# Patient Record
Sex: Female | Born: 1965 | Race: White | Hispanic: No | Marital: Single | Smoking: Never smoker
Health system: Southern US, Community
[De-identification: ages and names within clinical notes are randomized; demographics above are authoritative.]

## PROBLEM LIST (undated history)

## (undated) DIAGNOSIS — M1990 Unspecified osteoarthritis, unspecified site: Secondary | ICD-10-CM

## (undated) DIAGNOSIS — E079 Disorder of thyroid, unspecified: Secondary | ICD-10-CM

## (undated) DIAGNOSIS — F419 Anxiety disorder, unspecified: Secondary | ICD-10-CM

## (undated) DIAGNOSIS — E039 Hypothyroidism, unspecified: Secondary | ICD-10-CM

## (undated) HISTORY — DX: Anxiety disorder, unspecified: F41.9

## (undated) HISTORY — DX: Disorder of thyroid, unspecified: E07.9

---

## 2013-06-28 HISTORY — PX: HERNIA REPAIR: SHX51

## 2014-03-11 ENCOUNTER — Encounter: Payer: Self-pay | Admitting: Internal Medicine

## 2014-03-11 ENCOUNTER — Ambulatory Visit (INDEPENDENT_AMBULATORY_CARE_PROVIDER_SITE_OTHER): Payer: BC Managed Care – PPO | Admitting: Internal Medicine

## 2014-03-11 VITALS — BP 104/62 | HR 81 | Temp 97.6°F | Resp 12 | Ht 63.0 in | Wt 223.0 lb

## 2014-03-11 DIAGNOSIS — E039 Hypothyroidism, unspecified: Secondary | ICD-10-CM

## 2014-03-11 DIAGNOSIS — N951 Menopausal and female climacteric states: Secondary | ICD-10-CM

## 2014-03-11 LAB — T3, FREE: T3 FREE: 2.7 pg/mL (ref 2.3–4.2)

## 2014-03-11 LAB — T4, FREE: Free T4: 1.14 ng/dL (ref 0.60–1.60)

## 2014-03-11 LAB — TSH: TSH: 0.99 u[IU]/mL (ref 0.35–4.50)

## 2014-03-11 LAB — FOLLICLE STIMULATING HORMONE: FSH: 13.8 m[IU]/mL

## 2014-03-11 NOTE — Progress Notes (Signed)
Patient ID: Hannah Good, female   DOB: [birth year], 48 y.o.   MRN: [id number]   HPI  Hannah Good is a 48 y.o.-year-old female, referred by her PCP, Dr. Sherral Hammers, for management of hypothyroidism.  Pt. has been dx with hypothyroidism in ~1981; is on Levothyroxine, then on Synthroid DAW 112 mcg: - fasting - with water - separated by >30 min from b'fast  - no calcium, iron, PPIs, multivitamins   I reviewed pt's thyroid tests - per records reviewed from PCP: 10/03/2013: TSH 0.769, fT4 1.17 08/31/2012: TSH 1.329, fT4 1.12  Pt describes: - + excessive sweating (head) 30 min after taking Synthroid - lasts 20 min, then reoccurs during the day and throughout the night - + fatigue - + weight gain - in last year: 20 lbs - + constipation - No dry skin - No hair loss - no depression - + anxiety  - on Lorazepam, Pristiq - last 2 years - Dr. Earle Gell  Her last menstrual cycle: 5-6 mo ago.  Pt denies feeling nodules in neck, hoarseness, + occasional dysphagia (meat) /no odynophagia, no SOB with lying down.  She has + FH of thyroid disorders in: mother. + FH of thyroid cancer - niece.  No h/o radiation tx to head or neck.  No recent use of iodine supplements.  I reviewed her chart and she also has a history of anxiety.  ROS: Constitutional: + see HPI, + excess urination, + poor sleep  Eyes: + blurry vision, no xerophthalmia ENT: no sore throat, + see HPI  Cardiovascular: no CP/SOB/palpitations/leg swelling Respiratory: + both: cough/SOB Gastrointestinal: no N/V/D/+ occas. C/+ heartburn Musculoskeletal: no muscle/+ joint aches Skin: no rashes Neurological: no tremors/numbness/tingling/dizziness Psychiatric: no depression/+ anxiety + low libido  Past Medical History  Diagnosis Date  . Thyroid disease   . Anxiety    Past Surgical History  Procedure Laterality Date  . Cesarean section      3  . Hernia repair  2015   History   Occupational History  . homemaker   Social  History Main Topics  . Smoking status: Never Smoker   . Smokeless tobacco: Not on file  . Alcohol Use: ! Glass wine per mo  . Drug Use: No  Exercises on treadmill 3x a week  Social History Narrative   Married: 3 children, 49, 58, 24 yrs   Current Outpatient Rx  Name  Route  Sig  Dispense  Refill  . desvenlafaxine (PRISTIQ) 50 MG 24 hr tablet   Oral   Take 50 mg by mouth at bedtime.         Marland Kitchen levothyroxine (SYNTHROID) 112 MCG tablet   Oral   Take 112 mcg by mouth daily before breakfast.         . LORazepam (ATIVAN) 0.5 MG tablet   Oral   Take 0.5 mg by mouth as needed for anxiety.          NKDA   Family History  Problem Relation Age of Onset  . Chronic fatigue Mother   . Fibromyalgia Mother   . Cancer Father     leukemia  . Heart disease Father    PE: BP 104/62  Pulse 81  Temp(Src) 97.6 F (36.4 C) (Oral)  Resp 12  Ht  (1.6 m)  Wt 223 lb (101.152 kg)  BMI 39.51 kg/m2  SpO2 96% Wt Readings from Last 3 Encounters:  03/11/14 223 lb (101.152 kg)   Constitutional: overweight, in NAD Eyes: PERRLA, EOMI, no  exophthalmos ENT: moist mucous membranes, no thyromegaly, no cervical lymphadenopathy Cardiovascular: RRR, No MRG Respiratory: CTA B Gastrointestinal: abdomen soft, NT, ND, BS+ Musculoskeletal: no deformities, strength intact in all 4 Skin: moist, warm, no rashes Neurological: no tremor with outstretched hands, DTR normal in all 4  ASSESSMENT: 1. Hypothyroidism  2. Hot flushes  PLAN:  1. Patient with long-standing hypothyroidism, on levothyroxine therapy. She appears euthyroid. She does not appear to have a goiter, thyroid nodules, or neck compression symptoms - We discussed about correct intake of levothyroxine, fasting, with water, separated by at least 30 minutes from breakfast, and separated by more than 4 hours from calcium, iron, multivitamins, acid reflux medications (PPIs). She is taking it correctly.  - will check thyroid tests today:  TSH, free T4, free T3 and TPO antibodies - If these are abnormal, she will need to return in 6-8 weeks for repeat labs - If these are normal, I will see her back in 6 months  2. Hot flushes - she start having hot flushes 30 min after taking the Synthroid in am - I advised her to skip the dose for 2 days and see if the hot flushes occur. If they do not >> will switch her to taking 2.5 tabs of the 50 mcg Synthroid tablet as she may be allergic to one of the excipients.  - however, I believe that these are perimenopausal flushes >> will check FSH  Component     Latest Ref Rng 03/11/2014  TSH     0.35 - 4.50 uIU/mL 0.99  Free T4     0.60 - 1.60 ng/dL 1.61  T3, Free     2.3 - 4.2 pg/mL 2.7  Thyroid Peroxidase Antibody     <9 IU/mL 211 (H)  FSH      13.8   The Thyroid tests are normal, but antibodies positive >> Hashimoto's thyroiditis. The [facility] is not in the [facility], but getting there. She is perimenopausal.

## 2014-03-11 NOTE — Patient Instructions (Signed)
Please stop at the lab. Please come back for a follow-up appointment in 6 months. Continue the Synthroid 112 mcg for now.

## 2014-03-12 ENCOUNTER — Encounter: Payer: Self-pay | Admitting: Internal Medicine

## 2014-03-12 DIAGNOSIS — N951 Menopausal and female climacteric states: Secondary | ICD-10-CM | POA: Insufficient documentation

## 2014-03-12 DIAGNOSIS — E038 Other specified hypothyroidism: Secondary | ICD-10-CM | POA: Insufficient documentation

## 2014-03-12 DIAGNOSIS — E063 Autoimmune thyroiditis: Secondary | ICD-10-CM

## 2014-03-12 LAB — THYROID PEROXIDASE ANTIBODY: Thyroperoxidase Ab SerPl-aCnc: 211 IU/mL — ABNORMAL HIGH (ref <9)

## 2014-03-25 ENCOUNTER — Encounter: Payer: Self-pay | Admitting: Internal Medicine

## 2014-03-26 ENCOUNTER — Other Ambulatory Visit: Payer: Self-pay | Admitting: *Deleted

## 2014-03-26 MED ORDER — SYNTHROID 112 MCG PO TABS: 112.0000 ug | ORAL_TABLET | Freq: Every day | ORAL | Status: DC

## 2014-07-25 ENCOUNTER — Other Ambulatory Visit: Payer: Self-pay | Admitting: Internal Medicine

## 2014-09-02 ENCOUNTER — Encounter: Payer: Self-pay | Admitting: Internal Medicine

## 2014-09-09 ENCOUNTER — Ambulatory Visit: Payer: BC Managed Care – PPO | Admitting: Internal Medicine

## 2014-10-17 ENCOUNTER — Ambulatory Visit (INDEPENDENT_AMBULATORY_CARE_PROVIDER_SITE_OTHER): Payer: Self-pay | Admitting: Internal Medicine

## 2014-10-17 ENCOUNTER — Encounter: Payer: Self-pay | Admitting: Internal Medicine

## 2014-10-17 VITALS — BP 122/60 | HR 69 | Temp 97.5°F | Resp 12 | Wt 211.0 lb

## 2014-10-17 DIAGNOSIS — E063 Autoimmune thyroiditis: Secondary | ICD-10-CM

## 2014-10-17 DIAGNOSIS — E038 Other specified hypothyroidism: Secondary | ICD-10-CM

## 2014-10-17 LAB — T4, FREE: Free T4: 1.12 ng/dL (ref 0.60–1.60)

## 2014-10-17 LAB — TSH: TSH: 0.73 u[IU]/mL (ref 0.35–4.50)

## 2014-10-17 NOTE — Patient Instructions (Signed)
Please stop at the lab.  Please return in 1 year.  

## 2014-10-17 NOTE — Progress Notes (Signed)
Patient ID: Hannah Good, female   DOB: 08/07/1965, 49 y.o.   MRN: [id number]   HPI  ARIYAN SINNETT is a 49 y.o.-year-old female, returning for f/u for hypothyroidism, with a new Dx of Hashimoto's thyroiditis. Last visit 6 mo ago.  She lost 12 lbs since last visit (started 06/2014)   Reviewed and addended hx: Pt. has been dx with hypothyroidism in ~1981; is on Levothyroxine, now on Synthroid DAW 112 mcg: - fasting - with water - separated by >30 min from b'fast  - no calcium, iron, PPIs, multivitamins   I reviewed pt's thyroid tests  Lab Results  Component Value Date   TSH 0.99 03/11/2014   FREET4 1.14 03/11/2014  - per records reviewed from PCP: 10/03/2013: TSH 0.769, fT4 1.17 08/31/2012: TSH 1.329, fT4 1.12  At last visit, her TPO ABs were + >> Hashimoto's thyroiditis: Component     Latest Ref Rng 03/11/2014  Thyroid Peroxidase Antibody     <9 IU/mL 211 (H)   Pt describes: - + excessive sweating - flushes - not every day  - no fatigue - + weight loss - in last 6 mo - no constipation - no dry skin - no hair loss; + excessive hair growth - no depression - + anxiety  - on Lorazepam, Pristiq - Dr. Earle Gell   Pt denies feeling nodules in neck, hoarseness, no dysphagia /no odynophagia, no SOB with lying down.  She has + FH of thyroid disorders in: mother. + FH of thyroid cancer - niece.   She also has a history of anxiety.  ROS: Constitutional: + weight loss, no fatigue, + hot flushes Eyes: no blurry vision, no xerophthalmia ENT: no sore throat, no nodules palpated in throat, no dysphagia/odynophagia, no hoarseness Cardiovascular: no CP/SOB/palpitations/leg swelling Respiratory: no cough/SOB Gastrointestinal: no N/V/D/C Musculoskeletal: no muscle/joint aches Skin: no rashes Neurological: no tremors/numbness/tingling/dizziness + low libido  I reviewed pt's medications, allergies, PMH, social hx, family hx, and changes were documented in the history of present  illness. Otherwise, unchanged from my initial visit note: Past Medical History  Diagnosis Date  . Thyroid disease   . Anxiety    Past Surgical History  Procedure Laterality Date  . Cesarean section      3  . Hernia repair  2015   History   Occupational History  . homemaker   Social History Main Topics  . Smoking status: Never Smoker   . Smokeless tobacco: Not on file  . Alcohol Use: ! Glass wine per mo  . Drug Use: No  Exercises on treadmill 3x a week  Social History Narrative   Married: 3 children, 93, 60, 24 yrs   Current Outpatient Rx  Name  Route  Sig  Dispense  Refill  . desvenlafaxine (PRISTIQ) 50 MG 24 hr tablet   Oral   Take 50 mg by mouth at bedtime.         Marland Kitchen LORazepam (ATIVAN) 0.5 MG tablet   Oral   Take 0.5 mg by mouth as needed for anxiety.         . SYNTHROID 112 MCG tablet      TAKE 1 TABLET EVERY DAY BEFORE BREAKFAST   30 tablet   2     Dispense as written.    NKDA   Family History  Problem Relation Age of Onset  . Chronic fatigue Mother   . Fibromyalgia Mother   . Cancer Father     leukemia  . Heart disease Father  PE: BP 122/60 mmHg  Pulse 69  Temp(Src) 97.5 F (36.4 C) (Oral)  Resp 12  Wt 211 lb (95.709 kg)  SpO2 98% Body mass index is 37.39 kg/(m^2). Wt Readings from Last 3 Encounters:  10/17/14 211 lb (95.709 kg)  03/11/14 223 lb (101.152 kg)   Constitutional: overweight, in NAD Eyes: PERRLA, EOMI, no exophthalmos ENT: moist mucous membranes, + mild symmetric thyromegaly, no cervical lymphadenopathy Cardiovascular: RRR, No MRG Respiratory: CTA B Gastrointestinal: abdomen soft, NT, ND, BS+ Musculoskeletal: no deformities, strength intact in all 4 Skin: moist, warm, no rashes Neurological: no tremor with outstretched hands, DTR normal in all 4  ASSESSMENT: 1. Hypothyroidism  2. Hashimoto's thyroiditis  PLAN:  1. Patient with long-standing hypothyroidism, on levothyroxine therapy. She appears euthyroid. She  does not appear to have a goiter, thyroid nodules, or neck compression symptoms. She lost weight since last visit - diet - congratulated her! - we discussed about what Hashimoto's thyroiditis means - We discussed about correct intake of levothyroxine, fasting, with water, separated by at least 30 minutes from breakfast, and separated by more than 4 hours from calcium, iron, multivitamins, acid reflux medications (PPIs). She is taking it correctly.  - will check thyroid tests today: TSH, free T4 - If these are abnormal, she will need to return in 6-8 weeks for repeat labs - If these are normal, I will see her back in 1 year  2. Hashimoto's thyroiditis - We discussed about her new diagnosis of Hashimoto thyroiditis. I explained how we make this diagnosis, and the fact that this is an autoimmune disease. I also explained that she may have hypothyroid symptoms due to the higher antibody titer even when her TFTs are normal. Hashimoto's thyroiditis is treated with levothyroxine if there is associated hypothyroidism, which he already has.  Needs Synthroid refill - 3 mo.  Office Visit on 10/17/2014  Component Date Value Ref Range Status  . TSH 10/17/2014 0.73  0.35 - 4.50 uIU/mL Final  . Free T4 10/17/2014 1.12  0.60 - 1.60 ng/dL Final   TFTs normal >> will refill Synthroid DAW 112 mcg.

## 2014-10-18 DIAGNOSIS — E063 Autoimmune thyroiditis: Secondary | ICD-10-CM | POA: Insufficient documentation

## 2014-10-18 MED ORDER — SYNTHROID 112 MCG PO TABS: ORAL_TABLET | ORAL | Status: DC

## 2014-11-03 ENCOUNTER — Other Ambulatory Visit: Payer: Self-pay | Admitting: Internal Medicine

## 2015-10-14 ENCOUNTER — Other Ambulatory Visit: Payer: Self-pay | Admitting: *Deleted

## 2015-10-14 ENCOUNTER — Encounter: Payer: Self-pay | Admitting: Internal Medicine

## 2015-10-14 ENCOUNTER — Telehealth: Payer: Self-pay | Admitting: Internal Medicine

## 2015-10-14 MED ORDER — SYNTHROID 112 MCG PO TABS: ORAL_TABLET | ORAL | Status: DC

## 2015-10-14 NOTE — Telephone Encounter (Signed)
Pt sent a message via MyChart requesting refill to be sent to [facility].

## 2015-10-14 NOTE — Telephone Encounter (Signed)
Please call in a bridge of the pts meds until she can get into the appt in June, your 1st available

## 2015-10-14 NOTE — Telephone Encounter (Signed)
Bridge refills sent to pt's pharmacy.

## 2015-10-16 ENCOUNTER — Ambulatory Visit: Payer: Self-pay | Admitting: Internal Medicine

## 2015-10-21 ENCOUNTER — Ambulatory Visit: Payer: Self-pay | Admitting: Internal Medicine

## 2015-12-03 ENCOUNTER — Encounter: Payer: Self-pay | Admitting: Internal Medicine

## 2015-12-03 ENCOUNTER — Ambulatory Visit (INDEPENDENT_AMBULATORY_CARE_PROVIDER_SITE_OTHER): Payer: BLUE CROSS/BLUE SHIELD | Admitting: Internal Medicine

## 2015-12-03 VITALS — BP 122/60 | HR 81 | Temp 97.8°F | Resp 12 | Wt 227.0 lb

## 2015-12-03 DIAGNOSIS — E063 Autoimmune thyroiditis: Secondary | ICD-10-CM | POA: Diagnosis not present

## 2015-12-03 DIAGNOSIS — E038 Other specified hypothyroidism: Secondary | ICD-10-CM

## 2015-12-03 DIAGNOSIS — R7989 Other specified abnormal findings of blood chemistry: Secondary | ICD-10-CM

## 2015-12-03 LAB — T4, FREE: Free T4: 0.84 ng/dL (ref 0.60–1.60)

## 2015-12-03 LAB — TSH: TSH: 8.36 u[IU]/mL — ABNORMAL HIGH (ref 0.35–4.50)

## 2015-12-03 NOTE — Progress Notes (Unsigned)
Please see the my chart message the pt has not missed any doses [phone] cell #

## 2015-12-03 NOTE — Progress Notes (Signed)
Patient ID: Hannah Good, female   DOB: 1966/01/18, 50 y.o.   MRN: [id number]   HPI  Hannah Good is a 50 y.o.-year-old female, returning for f/u for hypothyroidism, with a new Dx of Hashimoto's thyroiditis. Last visit 1 year ago.  She had diverticulitis in 05/2015.  Reviewed and addended hx: Pt. has been dx with hypothyroidism in ~1981; is on Levothyroxine, now on Synthroid DAW 112 mcg: - fasting - with water - separated by >30 min from b'fast  - no calcium, iron, PPIs, multivitamins   I reviewed pt's thyroid tests  Lab Results  Component Value Date   TSH 0.73 10/17/2014   TSH 0.99 03/11/2014   FREET4 1.12 10/17/2014   FREET4 1.14 03/11/2014  - per records reviewed from PCP: 10/03/2013: TSH 0.769, fT4 1.17 08/31/2012: TSH 1.329, fT4 1.12  Her TPO ABs were + >> Hashimoto's thyroiditis: Component     Latest Ref Rng 03/11/2014  Thyroid Peroxidase Antibody     <9 IU/mL 211 (H)   Pt describes: - + hot flushes - no fatigue - no weight loss, + weight gain - no constipation - no dry skin - no hair loss; + excessive hair growth - no depression  Pt denies feeling nodules in neck, hoarseness, no dysphagia /no odynophagia, no SOB with lying down.  She has + FH of thyroid disorders in: mother. + FH of thyroid cancer - niece.   She also has a history of anxiety.  ROS: Constitutional: + weight gain, no fatigue, + hot flushes Eyes: no blurry vision, no xerophthalmia ENT: + sore throat, no nodules palpated in throat, no dysphagia/odynophagia, no hoarseness Cardiovascular: no CP/SOB/palpitations/leg swelling Respiratory: no cough/SOB Gastrointestinal: no N/V/D/C Musculoskeletal: no muscle/+ joint aches Skin: no rashes  I reviewed pt's medications, allergies, PMH, social hx, family hx, and changes were documented in the history of present illness. Otherwise, unchanged from my initial visit note: Past Medical History  Diagnosis Date  . Thyroid disease   . Anxiety     Past Surgical History  Procedure Laterality Date  . Cesarean section      3  . Hernia repair  2015   History   Occupational History  . homemaker   Social History Main Topics  . Smoking status: Never Smoker   . Smokeless tobacco: Not on file  . Alcohol Use: ! Glass wine per mo  . Drug Use: No  Exercises on treadmill 3x a week  Social History Narrative   Married: 3 children, 6711, 2822, 24 yrs   Current Outpatient Rx  Name  Route  Sig  Dispense  Refill  . desvenlafaxine (PRISTIQ) 50 MG 24 hr tablet   Oral   Take 50 mg by mouth at bedtime.         Marland Kitchen. LORazepam (ATIVAN) 0.5 MG tablet   Oral   Take 0.5 mg by mouth as needed for anxiety.         . meloxicam (MOBIC) 15 MG tablet               . progesterone (PROMETRIUM) 100 MG capsule   Oral   Take 100 mg by mouth. TAKE ONE CAPSULE BY MOUTH 30-45 MINUTES BEFORE BEDTIME         . SYNTHROID 112 MCG tablet      TAKE 1 TABLET EVERY DAY BEFORE BREAKFAST   30 tablet   2     Dispense as written.    NKDA   Family History  Problem Relation  Age of Onset  . Chronic fatigue Mother   . Fibromyalgia Mother   . Cancer Father     leukemia  . Heart disease Father    PE: BP 122/60 mmHg  Pulse 81  Temp(Src) 97.8 F (36.6 C) (Oral)  Resp 12  Wt 227 lb (102.967 kg)  SpO2 96% Body mass index is 40.22 kg/(m^2). Wt Readings from Last 3 Encounters:  12/03/15 227 lb (102.967 kg)  10/17/14 211 lb (95.709 kg)  03/11/14 223 lb (101.152 kg)   Constitutional: overweight, in NAD Eyes: PERRLA, EOMI, no exophthalmos ENT: moist mucous membranes, no thyromegaly, no cervical lymphadenopathy Cardiovascular: RRR, No MRG Respiratory: CTA B Gastrointestinal: abdomen soft, NT, ND, BS+ Musculoskeletal: no deformities, strength intact in all 4 Skin: moist, warm, no rashes Neurological: no tremor with outstretched hands, DTR normal in all 4  ASSESSMENT: 1. Hypothyroidism 2/2 Hashimoto's thyroiditis  2. Low Progesterone  PLAN:   1. Patient with long-standing hypothyroidism, on levothyroxine therapy (Synthroid 112 g daily). She appears euthyroid. She does not appear to have a goiter, thyroid nodules, or neck compression symptoms. - we discussed about what Hashimoto's thyroiditis means - We discussed about correct intake of levothyroxine, fasting, with water, separated by at least 30 minutes from breakfast, and separated by more than 4 hours from calcium, iron, multivitamins, acid reflux medications (PPIs). She is taking it correctly.  - will check thyroid tests today: TSH, free T4 - If these are abnormal, she will need to return in 6-8 weeks for repeat labs - If these are normal, I will see her back in 1 year  2. Low Progesterone - was in a study >> now on Prometrium 100 mg at night >> helps with sleep, but she feels dizzy - she was wondering if she needs to continue >> I advised her that she can stop the Pg and see how she feels w/o it  Needs Synthroid refill - 3 mo.  Office Visit on 12/03/2015  Component Date Value Ref Range Status  . Free T4 12/03/2015 0.84  0.60 - 1.60 ng/dL Final  . TSH 16/03/9603 8.36* 0.35 - 4.50 uIU/mL Final   Patient confirmed that she is taking the medication every day. In this case, I will send a prescription for Synthroid 125 g daily to her pharmacy and recheck her tests in 2 months.

## 2015-12-03 NOTE — Patient Instructions (Signed)
Please stop at the lab.  Please continue Synthroid 112 mcg daily.  Take the thyroid hormone every day, with water, at least 30 minutes before breakfast, separated by at least 4 hours from: - acid reflux medications - calcium - iron - multivitamins  Please return in 1 year. 

## 2015-12-04 MED ORDER — SYNTHROID 125 MCG PO TABS: 125.0000 ug | ORAL_TABLET | Freq: Every day | ORAL | Status: DC

## 2016-01-28 ENCOUNTER — Other Ambulatory Visit: Payer: BLUE CROSS/BLUE SHIELD

## 2016-01-28 ENCOUNTER — Other Ambulatory Visit (INDEPENDENT_AMBULATORY_CARE_PROVIDER_SITE_OTHER): Payer: BLUE CROSS/BLUE SHIELD

## 2016-01-28 DIAGNOSIS — E063 Autoimmune thyroiditis: Secondary | ICD-10-CM | POA: Diagnosis not present

## 2016-01-28 DIAGNOSIS — E038 Other specified hypothyroidism: Secondary | ICD-10-CM

## 2016-01-28 LAB — TSH: TSH: 0.8 u[IU]/mL (ref 0.35–4.50)

## 2016-01-28 LAB — T4, FREE: FREE T4: 1.16 ng/dL (ref 0.60–1.60)

## 2016-01-29 ENCOUNTER — Encounter: Payer: Self-pay | Admitting: Internal Medicine

## 2016-01-30 ENCOUNTER — Other Ambulatory Visit: Payer: Self-pay

## 2016-01-30 MED ORDER — SYNTHROID 125 MCG PO TABS: 125.0000 ug | ORAL_TABLET | Freq: Every day | ORAL | 0 refills | Status: DC

## 2016-04-01 ENCOUNTER — Other Ambulatory Visit: Payer: Self-pay

## 2016-04-01 MED ORDER — SYNTHROID 125 MCG PO TABS: 125.0000 ug | ORAL_TABLET | Freq: Every day | ORAL | 0 refills | Status: DC

## 2016-07-02 ENCOUNTER — Other Ambulatory Visit: Payer: Self-pay | Admitting: Surgery

## 2016-07-02 ENCOUNTER — Ambulatory Visit
Admission: RE | Admit: 2016-07-02 | Discharge: 2016-07-02 | Disposition: A | Discharge status: Home / Self Care | Payer: BLUE CROSS/BLUE SHIELD | Source: Ambulatory Visit | Attending: Surgery | Admitting: Surgery

## 2016-07-02 DIAGNOSIS — K5700 Diverticulitis of small intestine with perforation and abscess without bleeding: Secondary | ICD-10-CM

## 2016-07-02 IMAGING — CT CT ABD-PELV W/ CM
2 of 5 series · 16 of 46 positions shown, 18 images · IV contrast (APPLIED)
Comparison: None.

CLINICAL DATA: Abdominal pain. Intermittent diarrhea since [DATE].

EXAM:
CT ABDOMEN AND PELVIS WITH CONTRAST
TECHNIQUE: Multidetector CT imaging of the abdomen and pelvis was performed
using the standard protocol following bolus administration of
intravenous contrast.
CONTRAST:  100mL [YE] IOPAMIDOL ([YE]) INJECTION 61%

[Series 2: abd/pelvis w/cm · axial · 0.76mm/px · z∈[-667,-217]mm · 13 of 102 slices shown, 15 images]
[im 6/102  soft-tissue]
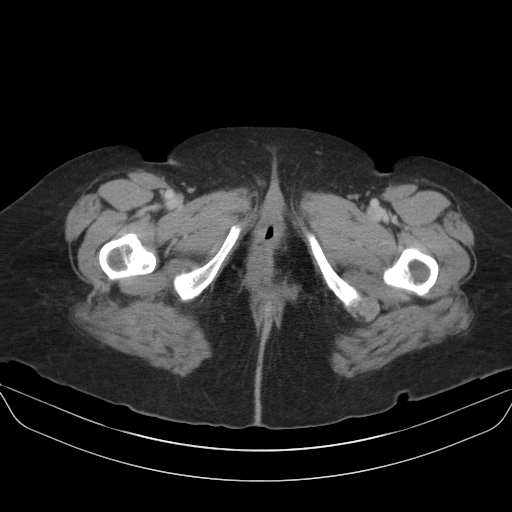
[im 6/102  bone]
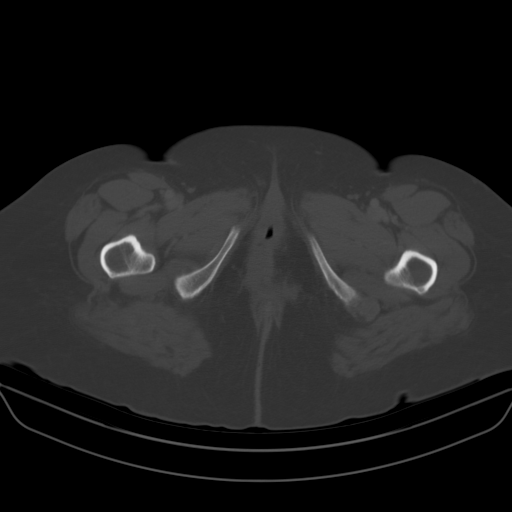
[im 12/102  soft-tissue]
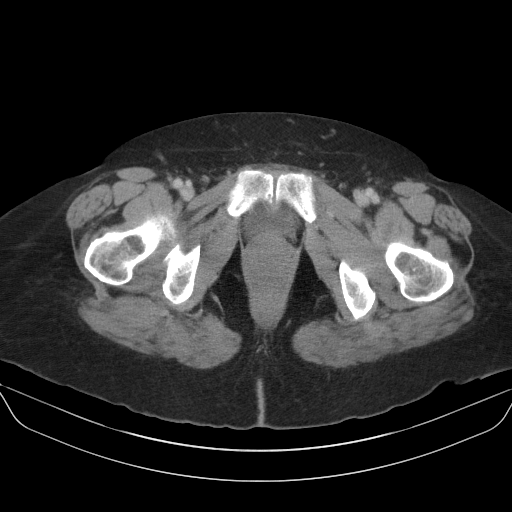
[im 24/102  soft-tissue]
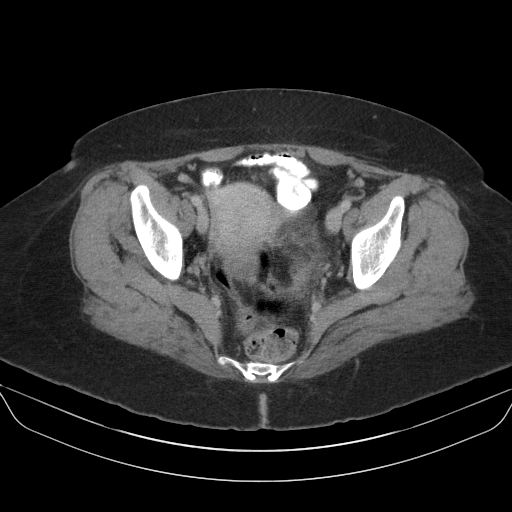
[im 30/102  soft-tissue]
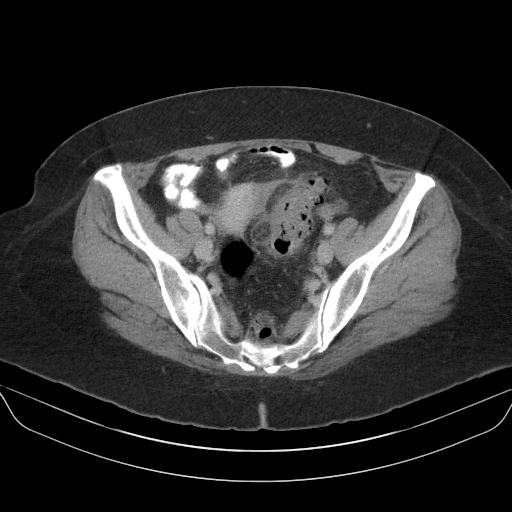
[im 36/102  soft-tissue]
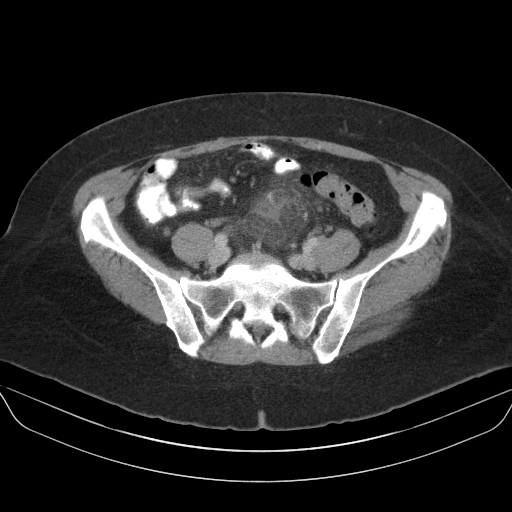
[im 42/102  soft-tissue]
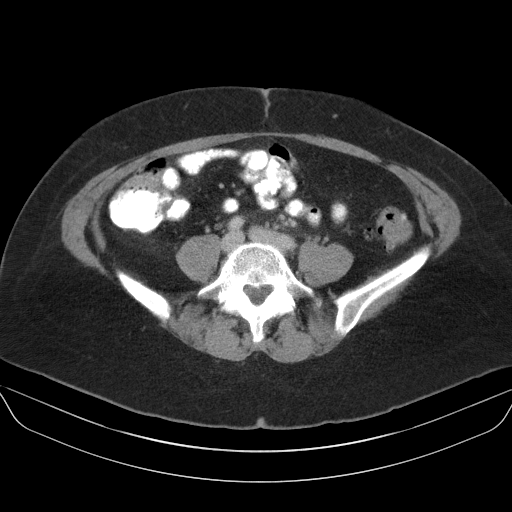
[im 54/102  soft-tissue]
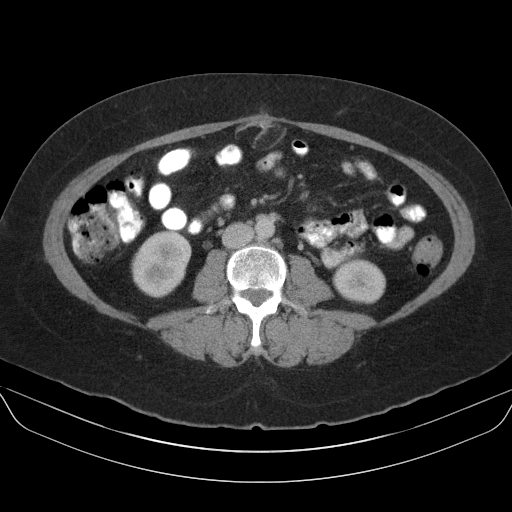
[im 60/102  soft-tissue]
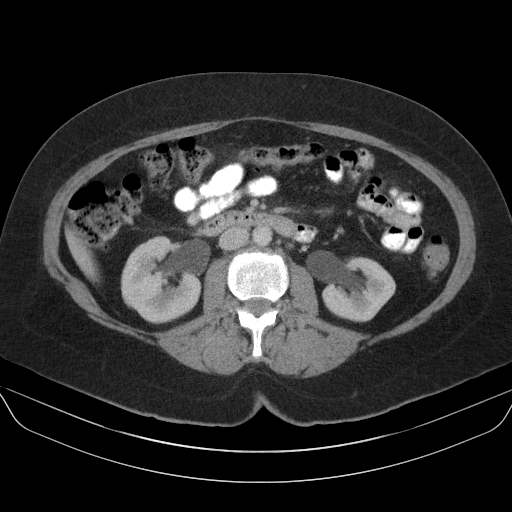
[im 66/102  soft-tissue]
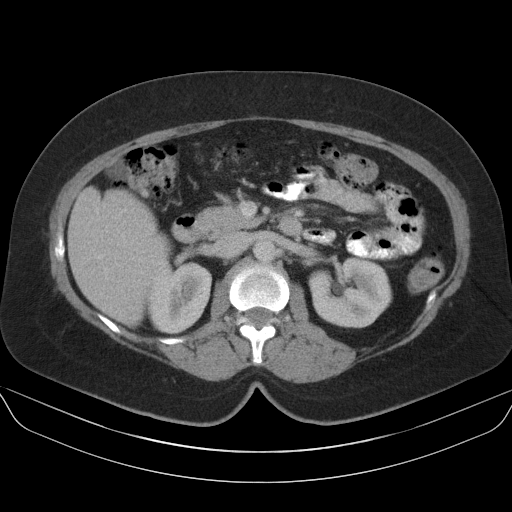
[im 66/102  bone]
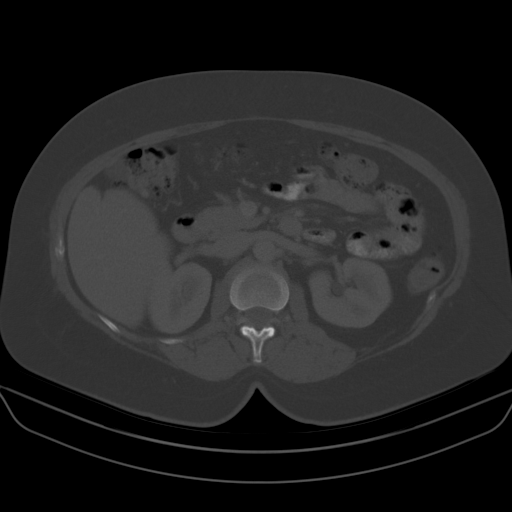
[im 72/102  soft-tissue]
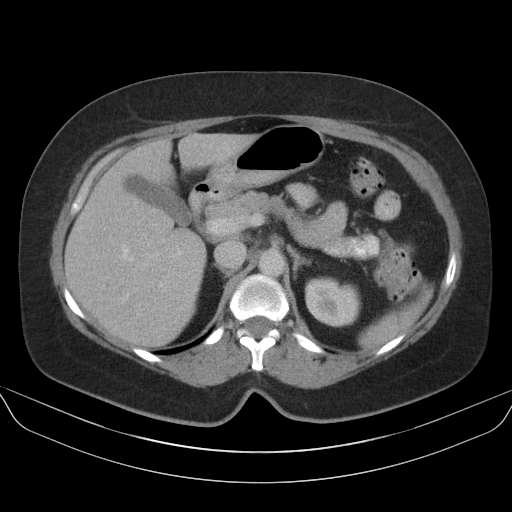
[im 78/102  soft-tissue]
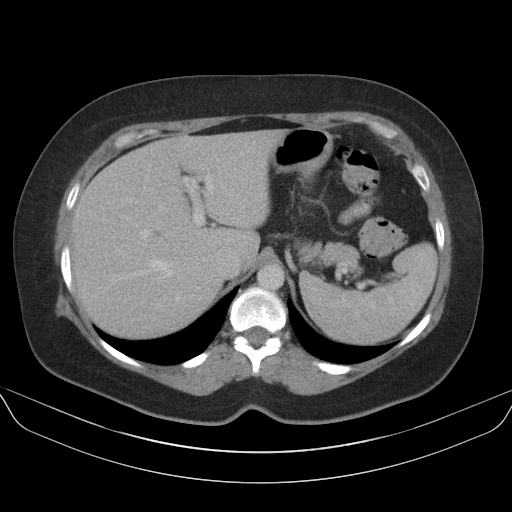
[im 90/102  soft-tissue]
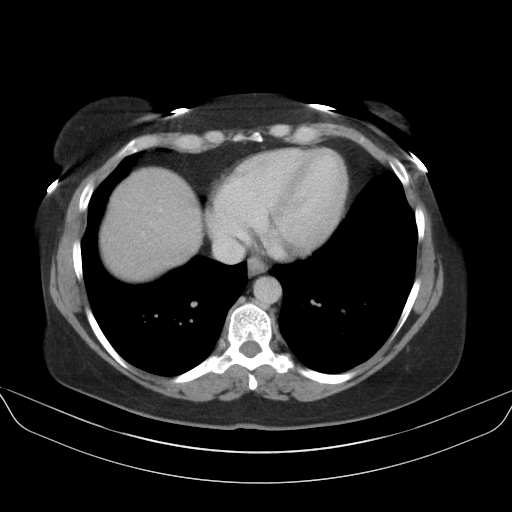
[im 96/102  soft-tissue]
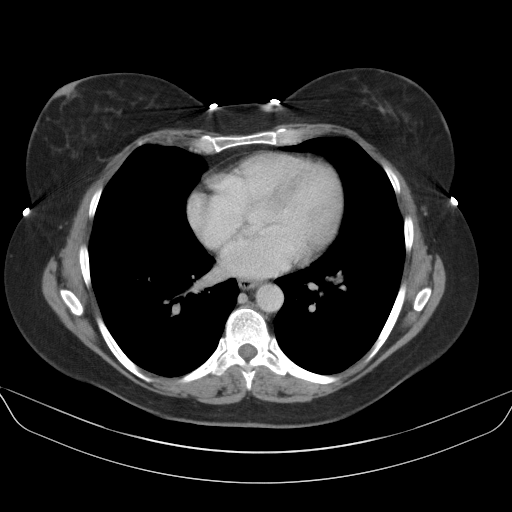

[Series 3: cor · coronal · 0.73mm/px · 3 of 94 slices shown]
[im 32/94  soft-tissue]
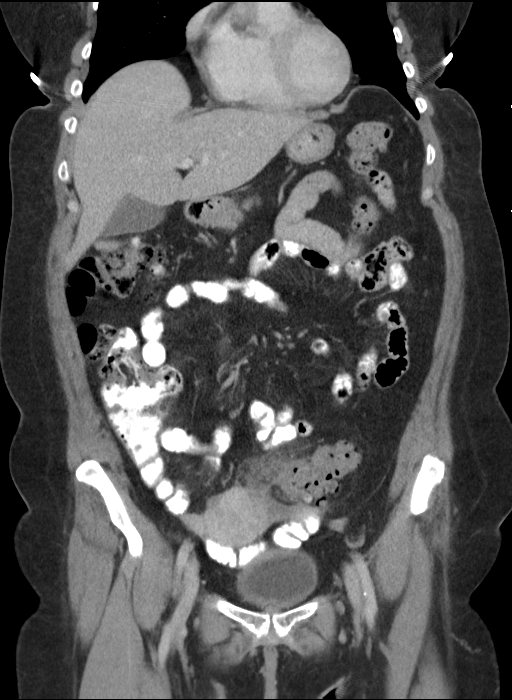
[im 42/94  soft-tissue]
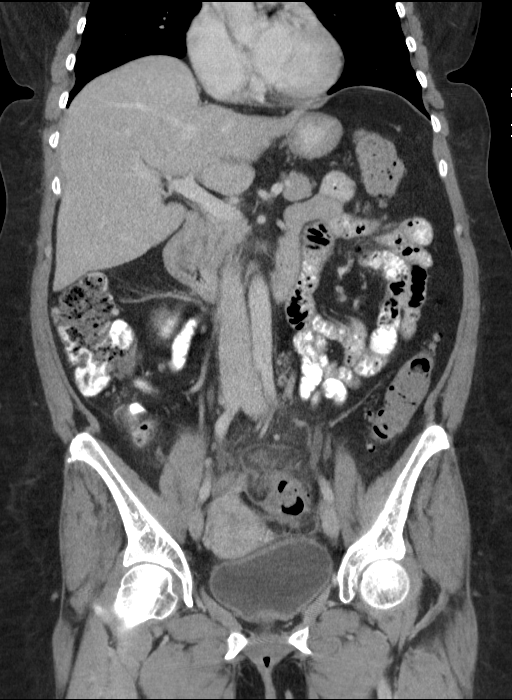
[im 52/94  soft-tissue]
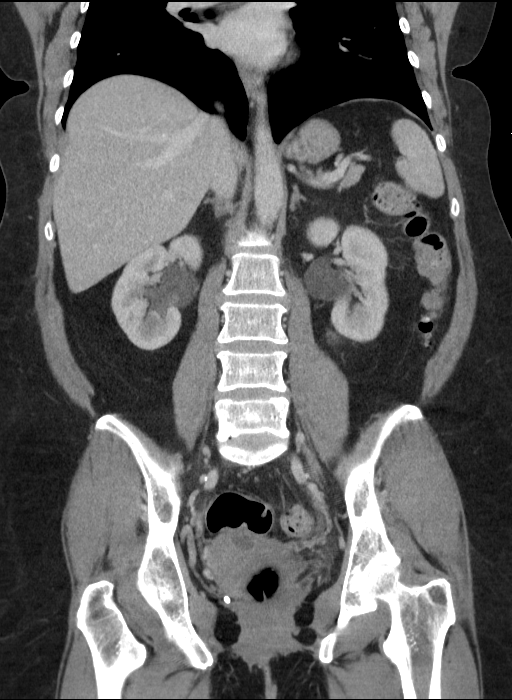

[16 of 46 positions shown; findings below may reference images not displayed]

FINDINGS: Lower chest: No acute abnormality.

Hepatobiliary: No focal liver abnormality is seen. No gallstones,
gallbladder wall thickening, or biliary dilatation.

Pancreas: Unremarkable. No pancreatic ductal dilatation or
surrounding inflammatory changes.

Spleen: Normal in size without focal abnormality.

Adrenals/Urinary Tract: Adrenal glands are unremarkable. 7 mm right
renal calculus. No obstructive uropathy. Extrarenal pelvis
bilaterally. Decompressed bladder.

Stomach/Bowel: Stomach is within normal limits. No pneumatosis,
pneumoperitoneum or portal venous gas. Diverticulosis of the sigmoid
colon with severe bowel wall thickening pericolonic inflammatory
changes most consistent with acute diverticulitis. No focal fluid
collection to suggest an abscess.

Vascular/Lymphatic: No significant vascular findings are present. No
enlarged abdominal or pelvic lymph nodes.

Reproductive: Uterus and bilateral adnexa are unremarkable.

Other: No abdominal wall hernia or abnormality. No abdominopelvic
ascites.

Musculoskeletal: No acute osseous abnormality. No lytic or sclerotic
osseous lesion.
IMPRESSION: 1. Acute sigmoid diverticulitis.  No peridiverticular abscess.
2. 7 mm nonobstructing right renal calculus.

## 2016-07-02 MED ORDER — IOPAMIDOL (ISOVUE-300) INJECTION 61%
100.0000 mL | Freq: Once | INTRAVENOUS | Status: AC | PRN
  Administered 2016-07-02: 100 mL via INTRAVENOUS

## 2016-08-26 ENCOUNTER — Other Ambulatory Visit: Payer: Self-pay | Admitting: Internal Medicine

## 2016-12-02 ENCOUNTER — Ambulatory Visit (INDEPENDENT_AMBULATORY_CARE_PROVIDER_SITE_OTHER): Payer: BLUE CROSS/BLUE SHIELD | Admitting: Internal Medicine

## 2016-12-02 ENCOUNTER — Encounter: Payer: Self-pay | Admitting: Internal Medicine

## 2016-12-02 VITALS — BP 112/80 | HR 70 | Wt 177.0 lb

## 2016-12-02 DIAGNOSIS — E063 Autoimmune thyroiditis: Secondary | ICD-10-CM

## 2016-12-02 DIAGNOSIS — E038 Other specified hypothyroidism: Secondary | ICD-10-CM

## 2016-12-02 NOTE — Patient Instructions (Signed)
Please stop Biotin and come back for labs in 3 days.  Continue Levothyroxine 125 mcg daily.  Take the thyroid hormone every day, with water, at least 30 minutes before breakfast, separated by at least 4 hours from: - acid reflux medications - calcium - iron - multivitamins  Please return in 1 year.

## 2016-12-02 NOTE — Progress Notes (Addendum)
Patient ID: Hannah Good, female   DOB: [birth year], 51 y.o.   MRN: [id number]   HPI  Hannah Good is a 52 y.o.-year-old female, returning for f/u for hypothyroidism, with a new Dx of Hashimoto's thyroiditis. Last visit 1 year ago.  She lost >50 lbs since last year!!! She is on the Next 56 days diet. No pork, no starches.  Reviewed and addended hx: Pt. has been dx with hypothyroidism in ~1981.  Pt is on levothyroxine 125 mcg daily (increased at last visit), taken: - in am - fasting - at least 30 min from b'fast or protein shake with almond milk 1h after - + Ca 2h later - no Fe, PPIs  - + MVI 2h after - + on Biotin (hair skin and nails) - last dose this am  I reviewed pt's thyroid tests: Lab Results  Component Value Date   TSH 0.80 01/28/2016   TSH 8.36 (H) 12/03/2015   TSH 0.73 10/17/2014   TSH 0.99 03/11/2014   FREET4 1.16 01/28/2016   FREET4 0.84 12/03/2015   FREET4 1.12 10/17/2014   FREET4 1.14 03/11/2014  - per records reviewed from PCP: 10/03/2013: TSH 0.769, fT4 1.17 08/31/2012: TSH 1.329, fT4 1.12  TPO Abs were positive >> Hashimoto's thyroiditis: Component     Latest Ref Rng 03/11/2014  Thyroid Peroxidase Antibody     <9 IU/mL 211 (H)   Pt denies: - feeling nodules in neck - hoarseness - + occasional dysphagia - postnasal drip - choking - SOB with lying down  She has + FH of thyroid disorders in: mother. + FH of thyroid cancer - niece.   She also has a history of anxiety.  ROS: Constitutional: + weight loss, no fatigue, no subjective hyperthermia, no subjective hypothermia Eyes: no blurry vision, no xerophthalmia ENT: no sore throat, + see HPI Cardiovascular: no CP/no SOB/no palpitations/no leg swelling Respiratory: no cough/no SOB/no wheezing Gastrointestinal: no N/no V/no D/no C/no acid reflux after weight loss Musculoskeletal: no muscle aches/no joint aches Skin: no rashes, no hair loss Neurological: no tremors/no numbness/no tingling/no  dizziness  I reviewed pt's medications, allergies, PMH, social hx, family hx, and changes were documented in the history of present illness. Otherwise, unchanged from my initial visit note.  Past Medical History:  Diagnosis Date  . Anxiety   . Thyroid disease    Past Surgical History:  Procedure Laterality Date  . CESAREAN SECTION     3  . HERNIA REPAIR  2015   History   Occupational History  . homemaker   Social History Main Topics  . Smoking status: Never Smoker   . Smokeless tobacco: Not on file  . Alcohol Use: ! Glass wine per mo  . Drug Use: No  Exercises on treadmill 3x a week  Social History Narrative   Married: 3 children   Current Outpatient Prescriptions  Medication Sig Dispense Refill  . desvenlafaxine (PRISTIQ) 50 MG 24 hr tablet Take 50 mg by mouth at bedtime.    Marland Kitchen LORazepam (ATIVAN) 0.5 MG tablet Take 0.5 mg by mouth as needed for anxiety.    . meloxicam (MOBIC) 15 MG tablet     . SYNTHROID 125 MCG tablet Take 1 tablet by mouth daily before breakfast. 90 tablet 0   No current facility-administered medications for this visit.     NKDA   Family History  Problem Relation Age of Onset  . Chronic fatigue Mother   . Fibromyalgia Mother   . Cancer Father  leukemia  . Heart disease Father    PE: BP 112/80 (BP Location: Left Arm, Patient Position: Sitting)   Pulse 70   Wt 177 lb (80.3 kg)   SpO2 98%   BMI 31.35 kg/m  Body mass index is 31.35 kg/m. Wt Readings from Last 3 Encounters:  12/02/16 177 lb (80.3 kg)  12/03/15 227 lb (103 kg)  10/17/14 211 lb (95.7 kg)   Constitutional: overweight, in NAD Eyes: PERRLA, EOMI, no exophthalmos ENT: moist mucous membranes, no thyromegaly, no cervical lymphadenopathy Cardiovascular: RRR, No MRG Respiratory: CTA B Gastrointestinal: abdomen soft, NT, ND, BS+ Musculoskeletal: no deformities, strength intact in all 4 Skin: moist, warm, no rashes Neurological: no tremor with outstretched hands, DTR  normal in all 4  ASSESSMENT: 1. Hypothyroidism 2/2 Hashimoto's thyroiditis  PLAN:  1.- latest thyroid labs reviewed with pt >> normal  - she continues on LT4 125 mcg daily - pt feels good on this dose. - we discussed about taking the thyroid hormone every day, with water, >30 minutes before breakfast, separated by >4 hours from acid reflux medications, calcium, iron, multivitamins. Pt. is taking it correctly, but takes Ca and MVI only 2 h after LT4. If TSH high >> will need to separate these more. - will check thyroid tests in few days as she took Biotin this am >> advised to stop for the next 3 days: TSH and fT4 - If labs are abnormal, she will need to return for repeat TFTs in 1.5 months - OTW, RTC in 1 year  Needs Synthroid refill -  3 Mo.   Component     Latest Ref Rng & Units 12/07/2016  TSH     0.35 - 4.50 uIU/mL 0.48  T4,Free(Direct)     0.60 - 1.60 ng/dL 1.611.07   Normal TFTs. However, since TSH is lower, I would like to repeat this in 3 mo to make sure she does not decrease her TSH further.  Carlus Pavlovristina Brealyn Baril, MD PhD [facility]

## 2016-12-07 ENCOUNTER — Other Ambulatory Visit (INDEPENDENT_AMBULATORY_CARE_PROVIDER_SITE_OTHER): Payer: BLUE CROSS/BLUE SHIELD

## 2016-12-07 DIAGNOSIS — E063 Autoimmune thyroiditis: Secondary | ICD-10-CM | POA: Diagnosis not present

## 2016-12-07 DIAGNOSIS — E038 Other specified hypothyroidism: Secondary | ICD-10-CM

## 2016-12-07 LAB — T4, FREE: FREE T4: 1.07 ng/dL (ref 0.60–1.60)

## 2016-12-07 LAB — TSH: TSH: 0.48 u[IU]/mL (ref 0.35–4.50)

## 2016-12-07 MED ORDER — SYNTHROID 125 MCG PO TABS: ORAL_TABLET | ORAL | 1 refills | Status: DC

## 2016-12-07 NOTE — Addendum Note (Signed)
Addended by: Carlus PavlovGHERGHE, Eufemio Strahm on: 12/07/2016 12:29 PM   Modules accepted: Orders

## 2017-06-27 ENCOUNTER — Other Ambulatory Visit: Payer: Self-pay | Admitting: Internal Medicine

## 2017-07-14 ENCOUNTER — Other Ambulatory Visit: Payer: Self-pay | Admitting: Otolaryngology

## 2017-07-14 DIAGNOSIS — J209 Acute bronchitis, unspecified: Secondary | ICD-10-CM

## 2017-09-14 ENCOUNTER — Other Ambulatory Visit: Payer: BLUE CROSS/BLUE SHIELD

## 2017-12-02 ENCOUNTER — Ambulatory Visit: Payer: BLUE CROSS/BLUE SHIELD | Admitting: Internal Medicine

## 2017-12-05 ENCOUNTER — Encounter: Payer: Self-pay | Admitting: Internal Medicine

## 2017-12-05 ENCOUNTER — Ambulatory Visit: Payer: BLUE CROSS/BLUE SHIELD | Admitting: Internal Medicine

## 2017-12-05 VITALS — BP 112/80 | HR 80 | Ht 62.5 in | Wt 218.6 lb

## 2017-12-05 DIAGNOSIS — E063 Autoimmune thyroiditis: Secondary | ICD-10-CM

## 2017-12-05 DIAGNOSIS — E038 Other specified hypothyroidism: Secondary | ICD-10-CM

## 2017-12-05 LAB — TSH: TSH: 0.07 u[IU]/mL — ABNORMAL LOW (ref 0.35–4.50)

## 2017-12-05 LAB — T4, FREE: FREE T4: 1.27 ng/dL (ref 0.60–1.60)

## 2017-12-05 MED ORDER — SYNTHROID 112 MCG PO TABS
112.0000 ug | ORAL_TABLET | Freq: Every day | ORAL | 5 refills | Status: DC
Start: 2017-12-05 — End: 2018-01-09

## 2017-12-05 NOTE — Patient Instructions (Signed)
Continue Levothyroxine 125 mcg daily.  Take the thyroid hormone every day, with water, at least 30 minutes before breakfast, separated by at least 4 hours from: - acid reflux medications - calcium - iron - multivitamins  Please stop at the lab.  Please return in 1 year.

## 2017-12-05 NOTE — Progress Notes (Signed)
Patient ID: Hannah Good, female   DOB: August 19, 1965, 52 y.o.   MRN: [id number]   HPI  Hannah Sodanne S Lefevers is a 52 y.o.-year-old female, returning for f/u for hypothyroidism 2/2 Hashimoto's thyroiditis. Last visit 1 year ago.  She lost >50 lbs before last visit on the Next 56 days diet. No pork, no starches. Since then, she stopped the diet as her family did not adopt it and it was difficult for her to cook 2 types of meals.   Pt. has been dx with hypothyroidism in ~1981.  Pt is on levothyroxine DAW 125 mcg daily, taken: - in am - fasting - at least 30 min from b'fast - no Ca, MVI, PPIs, Fe  - stopped on Biotin (hair skin and nails vitamins)  Reviewed patient's TFTs: Lab Results  Component Value Date   TSH 0.48 12/07/2016   TSH 0.80 01/28/2016   TSH 8.36 (H) 12/03/2015   TSH 0.73 10/17/2014   TSH 0.99 03/11/2014   FREET4 1.07 12/07/2016   FREET4 1.16 01/28/2016   FREET4 0.84 12/03/2015   FREET4 1.12 10/17/2014   FREET4 1.14 03/11/2014  - per records reviewed from PCP: 10/03/2013: TSH 0.769, fT4 1.17 08/31/2012: TSH 1.329, fT4 1.12  TPO Abs were positive >> TPO antibodies were positive, confirming Hashimoto's thyroiditis: Component     Latest Ref Rng 03/11/2014  Thyroid Peroxidase Antibody     <9 IU/mL 211 (H)   Pt denies: - feeling nodules in neck - hoarseness - dysphagia - choking - SOB with lying down  She has + FH of thyroid disorders in: mother. + FH of thyroid cancer in niece.  No h/o radiation tx to head or neck.  No herbal supplements. No Biotin use. No recent steroids use.   She also has a history of anxiety, controlled now.  ROS: Constitutional: + weight gain/no weight loss, no fatigue, no subjective hyperthermia, no subjective hypothermia Eyes: no blurry vision, no xerophthalmia ENT: no sore throat, + see HPI Cardiovascular: no CP/no SOB/no palpitations/no leg swelling Respiratory: no cough/no SOB/no wheezing Gastrointestinal: no N/no V/no D/no C/no  acid reflux Musculoskeletal: no muscle aches/no joint aches Skin: no rashes, no hair loss Neurological: no tremors/no numbness/no tingling/no dizziness  I reviewed pt's medications, allergies, PMH, social hx, family hx, and changes were documented in the history of present illness. Otherwise, unchanged from my initial visit note.  Past Medical History:  Diagnosis Date  . Anxiety   . Thyroid disease    Past Surgical History:  Procedure Laterality Date  . CESAREAN SECTION     3  . HERNIA REPAIR  2015   History   Occupational History  . homemaker   Social History Main Topics  . Smoking status: Never Smoker   . Smokeless tobacco: Not on file  . Alcohol Use: ! Glass wine per mo  . Drug Use: No  Exercises on treadmill 3x a week  Social History Narrative   Married: 3 children   Current Outpatient Medications  Medication Sig Dispense Refill  . desvenlafaxine (PRISTIQ) 50 MG 24 hr tablet Take 50 mg by mouth at bedtime.    Marland Kitchen. LORazepam (ATIVAN) 0.5 MG tablet Take 0.5 mg by mouth as needed for anxiety.    . meloxicam (MOBIC) 15 MG tablet     . SYNTHROID 125 MCG tablet Take 1 tablet by mouth daily before breakfast. 90 tablet 1   No current facility-administered medications for this visit.     NKDA   Family History  Problem Relation Age of Onset  . Chronic fatigue Mother   . Fibromyalgia Mother   . Cancer Father        leukemia  . Heart disease Father    PE: BP 112/80 (BP Location: Left Arm, Patient Position: Sitting, Cuff Size: Normal)   Pulse 80   Ht 5' 2.5" (1.588 m)   Wt 218 lb 9.6 oz (99.2 kg)   SpO2 98%   BMI 39.35 kg/m  Body mass index is 39.35 kg/m. Wt Readings from Last 3 Encounters:  12/05/17 218 lb 9.6 oz (99.2 kg)  12/02/16 177 lb (80.3 kg)  12/03/15 227 lb (103 kg)   Constitutional: overweight, in NAD Eyes: PERRLA, EOMI, no exophthalmos ENT: moist mucous membranes, no thyromegaly, no cervical lymphadenopathy Cardiovascular: RRR, No  MRG Respiratory: CTA B Gastrointestinal: abdomen soft, NT, ND, BS+ Musculoskeletal: no deformities, strength intact in all 4 Skin: moist, warm, no rashes Neurological: no tremor with outstretched hands, DTR normal in all 4  ASSESSMENT: 1. Hypothyroidism 2/2 Hashimoto's thyroiditis  PLAN:  1.  Patient with history of hypothyroidism due to Hashimoto's thyroiditis - latest thyroid labs reviewed with pt >> normal  - she continues on LT4 125 mcg daily - pt feels good on this dose, w/o complaints - we discussed about taking the thyroid hormone every day, with water, >30 minutes before breakfast, separated by >4 hours from acid reflux medications, calcium, iron, multivitamins. Pt. is taking it correctly now.  At last visit, she was taking multivitamins only 2 hours after levothyroxine so I advised her to move the MVIs later in the day. Since last visit, she stopped the Ca and MVI, and also the Biotin. - will check thyroid tests today: TSH and fT4 - If labs are abnormal, she will need to return for repeat TFTs in 1.5 months - RTC in 1 year  Needs Synthroid refill -  3 mo at a time.  - time spent with the patient: 15 min, of which >50% was spent in obtaining information about her symptoms, reviewing her previous labs, evaluations, and treatments, counseling her about her condition and developing a plan to further investigate and treat it.  Component     Latest Ref Rng & Units 12/05/2017  TSH     0.35 - 4.50 uIU/mL 0.07 (L)  T4,Free(Direct)     0.60 - 1.60 ng/dL 1.32   TSH is too suppressed.  We will decrease the dose of levothyroxine to 112 mcg daily and have her back for labs in 1.5 months.  Carlus Pavlov, MD PhD [facility] Endocrinology

## 2018-01-09 ENCOUNTER — Other Ambulatory Visit (INDEPENDENT_AMBULATORY_CARE_PROVIDER_SITE_OTHER): Payer: BLUE CROSS/BLUE SHIELD

## 2018-01-09 ENCOUNTER — Other Ambulatory Visit: Payer: Self-pay | Admitting: Internal Medicine

## 2018-01-09 DIAGNOSIS — E063 Autoimmune thyroiditis: Principal | ICD-10-CM

## 2018-01-09 DIAGNOSIS — E038 Other specified hypothyroidism: Secondary | ICD-10-CM | POA: Diagnosis not present

## 2018-01-09 LAB — TSH: TSH: 0.07 u[IU]/mL — AB (ref 0.35–4.50)

## 2018-01-09 LAB — T4, FREE: Free T4: 1.16 ng/dL (ref 0.60–1.60)

## 2018-01-09 MED ORDER — SYNTHROID 100 MCG PO TABS: 100.0000 ug | ORAL_TABLET | Freq: Every day | ORAL | 2 refills | Status: DC

## 2018-04-25 ENCOUNTER — Other Ambulatory Visit: Payer: Self-pay | Admitting: Internal Medicine

## 2018-08-28 ENCOUNTER — Other Ambulatory Visit: Payer: Self-pay | Admitting: Internal Medicine

## 2018-09-25 ENCOUNTER — Encounter: Payer: Self-pay | Admitting: Internal Medicine

## 2018-12-01 ENCOUNTER — Other Ambulatory Visit: Payer: Self-pay

## 2018-12-06 ENCOUNTER — Ambulatory Visit: Payer: BLUE CROSS/BLUE SHIELD | Admitting: Internal Medicine

## 2018-12-06 ENCOUNTER — Other Ambulatory Visit: Payer: BLUE CROSS/BLUE SHIELD

## 2018-12-06 ENCOUNTER — Other Ambulatory Visit: Payer: Self-pay

## 2018-12-06 ENCOUNTER — Encounter: Payer: Self-pay | Admitting: Internal Medicine

## 2018-12-06 VITALS — BP 120/70 | HR 94 | Ht 62.4 in | Wt 232.0 lb

## 2018-12-06 DIAGNOSIS — E038 Other specified hypothyroidism: Secondary | ICD-10-CM

## 2018-12-06 DIAGNOSIS — E063 Autoimmune thyroiditis: Secondary | ICD-10-CM

## 2018-12-06 DIAGNOSIS — R1310 Dysphagia, unspecified: Secondary | ICD-10-CM | POA: Diagnosis not present

## 2018-12-06 LAB — T4, FREE: Free T4: 0.94 ng/dL (ref 0.60–1.60)

## 2018-12-06 LAB — TSH: TSH: 5.57 u[IU]/mL — ABNORMAL HIGH (ref 0.35–4.50)

## 2018-12-06 MED ORDER — SYNTHROID 100 MCG PO TABS: 100.0000 ug | ORAL_TABLET | Freq: Every day | ORAL | 2 refills | Status: DC

## 2018-12-06 NOTE — Patient Instructions (Addendum)
Continue Synthroid 100 mcg daily.  Take the thyroid hormone every day, with water, at least 30 minutes before breakfast, separated by at least 4 hours from: - acid reflux medications - calcium - iron - multivitamins  Please stop at the lab.  Please return in 1 year.  

## 2018-12-06 NOTE — Progress Notes (Signed)
Patient ID: Hannah Good, female   DOB: Feb 18, 1966, 53 y.o.   MRN: [id number]   HPI  Hannah Sodanne S Fasching is a 53 y.o.-year-old female, returning for f/u for hypothyroidism 2/2 Hashimoto's thyroiditis. Last visit 1 year ago.  Pt. has been dx with hypothyroidism in approximately 1981.  Pt is on Synthroid 100 Mcg daily (dose decreased 12/2017), taken: - in am - fasting - at least 30 min from b'fast - no Ca, Fe, MVI, PPIs - not on Biotin  Reviewed patient's TFTs: Lab Results  Component Value Date   TSH 0.07 (L) 01/09/2018   TSH 0.07 (L) 12/05/2017   TSH 0.48 12/07/2016   TSH 0.80 01/28/2016   TSH 8.36 (H) 12/03/2015   FREET4 1.16 01/09/2018   FREET4 1.27 12/05/2017   FREET4 1.07 12/07/2016   FREET4 1.16 01/28/2016   FREET4 0.84 12/03/2015  - per records reviewed from PCP: 10/03/2013: TSH 0.769, fT4 1.17 08/31/2012: TSH 1.329, fT4 1.12  She had positive TPO antibodies: Component     Latest Ref Rng 03/11/2014  Thyroid Peroxidase Antibody     <9 IU/mL 211 (H)   Pt denies: - feeling nodules in neck - hoarseness - choking - SOB with lying down But does have occasional dysphagia with meat or other foods - smtms w/o foods.  She has + FH of thyroid disorders in: mother.  + FH of thyroid cancer in niece.   No h/o radiation tx to head or neck.  No seaweed or kelp. No recent contrast studies. No herbal supplements. No Biotin use. No recent steroids use.   She also has a history of anxiety.  She was previously on the next 56 days diet, on which she lost more than 15 pounds.  However, she came off the diet and gained a significant amount of weight afterwards.  ROS: Constitutional: + weight gain/no weight loss, no fatigue, no subjective hyperthermia, no subjective hypothermia Eyes: no blurry vision, no xerophthalmia ENT: no sore throat, + see HPI Cardiovascular: no CP/no SOB/no palpitations/no leg swelling Respiratory: no cough/no SOB/no wheezing Gastrointestinal: no N/no V/no  D/no C/no acid reflux Musculoskeletal: no muscle aches/no joint aches Skin: no rashes, no hair loss Neurological: no tremors/no numbness/no tingling/no dizziness  I reviewed pt's medications, allergies, PMH, social hx, family hx, and changes were documented in the history of present illness. Otherwise, unchanged from my initial visit note.  Past Medical History:  Diagnosis Date  . Anxiety   . Thyroid disease    Past Surgical History:  Procedure Laterality Date  . CESAREAN SECTION     3  . HERNIA REPAIR  2015   History   Occupational History  . homemaker   Social History Main Topics  . Smoking status: Never Smoker   . Smokeless tobacco: Not on file  . Alcohol Use: ! Glass wine per mo  . Drug Use: No  Exercises on treadmill 3x a week  Social History Narrative   Married: 3 children   Current Outpatient Medications  Medication Sig Dispense Refill  . desvenlafaxine (PRISTIQ) 50 MG 24 hr tablet Take 50 mg by mouth at bedtime.    Marland Kitchen. loratadine (CLARITIN) 10 MG tablet Take 10 mg by mouth daily.    Marland Kitchen. LORazepam (ATIVAN) 0.5 MG tablet Take 0.5 mg by mouth as needed for anxiety.    . meloxicam (MOBIC) 15 MG tablet     . montelukast (SINGULAIR) 10 MG tablet Take 10 mg by mouth daily.    Marland Kitchen. SYNTHROID 100 MCG  tablet Take 1 tablet (100 mcg total) by mouth daily before breakfast. 45 tablet 2   No current facility-administered medications for this visit.     NKDA   Family History  Problem Relation Age of Onset  . Chronic fatigue Mother   . Fibromyalgia Mother   . Cancer Father        leukemia  . Heart disease Father    PE: BP 120/70   Pulse 94   Ht 5' 2.4" (1.585 m)   Wt 232 lb (105.2 kg)   SpO2 98%   BMI 41.89 kg/m  Body mass index is 39.47 kg/m. Wt Readings from Last 3 Encounters:  12/06/18 232 lb (105.2 kg)  12/05/17 218 lb 9.6 oz (99.2 kg)  12/02/16 177 lb (80.3 kg)   Constitutional: overweight, in NAD Eyes: PERRLA, EOMI, no exophthalmos ENT: moist mucous  membranes, no thyromegaly, no cervical lymphadenopathy Cardiovascular: Tachycardia, RR, No MRG Respiratory: CTA B Gastrointestinal: abdomen soft, NT, ND, BS+ Musculoskeletal: no deformities, strength intact in all 4 Skin: moist, warm, no rashes Neurological: no tremor with outstretched hands, DTR normal in all 4  ASSESSMENT: 1. Hypothyroidism 2/2 Hashimoto's thyroiditis  2. Dysphagia  PLAN:  1.  Patient with history of hypothyroidism due to Hashimoto's thyroiditis - latest thyroid labs reviewed with pt >> TSH suppressed 12/2017 - she continues on LT4 100 mcg daily (last dose decrease 12/2017).  She did not come back for labs afterwards... - pt feels good on this dose. - we discussed about taking the thyroid hormone every day, with water, >30 minutes before breakfast, separated by >4 hours from acid reflux medications, calcium, iron, multivitamins. Pt. is taking it correctly. - will check thyroid tests today: TSH and fT4 - If labs are abnormal, she will need to return for repeat TFTs in 1.5 months  2. Dysphagia - new - possibly related to anxiety or also Hashimoto's thyroiditis - discussed mechanism  - will just follow this for now  -if persists and is continuous >> may need a thyroid ultrasound  Needs refills.  Component     Latest Ref Rng & Units 12/06/2018  TSH     0.35 - 4.50 uIU/mL 5.57 (H)  T4,Free(Direct)     0.60 - 1.60 ng/dL 0.94  TSH is slightly high but she had a suppressed TSH on the next higher level in the past, so for now, I would like to repeat her test in 1.5 months before changing the levothyroxine dose.  Philemon Kingdom, MD PhD [facility] Endocrinology

## 2019-01-23 ENCOUNTER — Other Ambulatory Visit (INDEPENDENT_AMBULATORY_CARE_PROVIDER_SITE_OTHER): Payer: BC Managed Care – PPO

## 2019-01-23 ENCOUNTER — Other Ambulatory Visit: Payer: Self-pay

## 2019-01-23 DIAGNOSIS — E063 Autoimmune thyroiditis: Secondary | ICD-10-CM

## 2019-01-23 DIAGNOSIS — E038 Other specified hypothyroidism: Secondary | ICD-10-CM

## 2019-01-23 LAB — T3, FREE: T3, Free: 2.8 pg/mL (ref 2.3–4.2)

## 2019-01-23 LAB — T4, FREE: Free T4: 1.03 ng/dL (ref 0.60–1.60)

## 2019-01-23 LAB — TSH: TSH: 4.1 u[IU]/mL (ref 0.35–4.50)

## 2019-01-24 ENCOUNTER — Other Ambulatory Visit: Payer: Self-pay | Admitting: Internal Medicine

## 2019-01-24 DIAGNOSIS — E038 Other specified hypothyroidism: Secondary | ICD-10-CM

## 2019-02-06 DIAGNOSIS — Z20828 Contact with and (suspected) exposure to other viral communicable diseases: Secondary | ICD-10-CM | POA: Diagnosis not present

## 2019-02-12 DIAGNOSIS — Z1239 Encounter for other screening for malignant neoplasm of breast: Secondary | ICD-10-CM | POA: Diagnosis not present

## 2019-02-12 DIAGNOSIS — Z01419 Encounter for gynecological examination (general) (routine) without abnormal findings: Secondary | ICD-10-CM | POA: Diagnosis not present

## 2019-04-13 DIAGNOSIS — Z1231 Encounter for screening mammogram for malignant neoplasm of breast: Secondary | ICD-10-CM | POA: Diagnosis not present

## 2019-04-18 DIAGNOSIS — M5010 Cervical disc disorder with radiculopathy, unspecified cervical region: Secondary | ICD-10-CM | POA: Diagnosis not present

## 2019-04-26 ENCOUNTER — Other Ambulatory Visit: Payer: Self-pay

## 2019-04-26 DIAGNOSIS — E038 Other specified hypothyroidism: Secondary | ICD-10-CM

## 2019-04-26 DIAGNOSIS — E063 Autoimmune thyroiditis: Secondary | ICD-10-CM

## 2019-04-26 MED ORDER — SYNTHROID 100 MCG PO TABS: 100.0000 ug | ORAL_TABLET | Freq: Every day | ORAL | 2 refills | Status: DC

## 2019-05-16 ENCOUNTER — Other Ambulatory Visit: Payer: Self-pay

## 2019-05-16 ENCOUNTER — Other Ambulatory Visit (INDEPENDENT_AMBULATORY_CARE_PROVIDER_SITE_OTHER): Payer: BC Managed Care – PPO

## 2019-05-16 DIAGNOSIS — E038 Other specified hypothyroidism: Secondary | ICD-10-CM | POA: Diagnosis not present

## 2019-05-16 DIAGNOSIS — E063 Autoimmune thyroiditis: Secondary | ICD-10-CM

## 2019-05-16 LAB — T4, FREE: Free T4: 1.04 ng/dL (ref 0.60–1.60)

## 2019-05-16 LAB — TSH: TSH: 3.63 u[IU]/mL (ref 0.35–4.50)

## 2019-05-21 ENCOUNTER — Other Ambulatory Visit: Payer: BC Managed Care – PPO

## 2019-07-30 DIAGNOSIS — Z20828 Contact with and (suspected) exposure to other viral communicable diseases: Secondary | ICD-10-CM | POA: Diagnosis not present

## 2019-07-30 DIAGNOSIS — J3489 Other specified disorders of nose and nasal sinuses: Secondary | ICD-10-CM | POA: Diagnosis not present

## 2019-08-28 DIAGNOSIS — K5732 Diverticulitis of large intestine without perforation or abscess without bleeding: Secondary | ICD-10-CM | POA: Diagnosis not present

## 2019-09-04 ENCOUNTER — Other Ambulatory Visit: Payer: Self-pay

## 2019-09-04 DIAGNOSIS — E063 Autoimmune thyroiditis: Secondary | ICD-10-CM

## 2019-09-04 DIAGNOSIS — E038 Other specified hypothyroidism: Secondary | ICD-10-CM

## 2019-09-04 MED ORDER — SYNTHROID 100 MCG PO TABS: 100.0000 ug | ORAL_TABLET | Freq: Every day | ORAL | 2 refills | Status: DC

## 2019-09-10 DIAGNOSIS — E669 Obesity, unspecified: Secondary | ICD-10-CM | POA: Diagnosis not present

## 2019-09-10 DIAGNOSIS — Z20828 Contact with and (suspected) exposure to other viral communicable diseases: Secondary | ICD-10-CM | POA: Diagnosis not present

## 2019-09-10 DIAGNOSIS — Z7189 Other specified counseling: Secondary | ICD-10-CM | POA: Diagnosis not present

## 2019-10-29 ENCOUNTER — Encounter: Payer: Self-pay | Admitting: Internal Medicine

## 2019-11-09 DIAGNOSIS — G8929 Other chronic pain: Secondary | ICD-10-CM | POA: Diagnosis not present

## 2019-11-09 DIAGNOSIS — M25562 Pain in left knee: Secondary | ICD-10-CM | POA: Diagnosis not present

## 2019-11-09 DIAGNOSIS — M2550 Pain in unspecified joint: Secondary | ICD-10-CM | POA: Diagnosis not present

## 2019-11-09 DIAGNOSIS — M1712 Unilateral primary osteoarthritis, left knee: Secondary | ICD-10-CM | POA: Diagnosis not present

## 2019-11-13 DIAGNOSIS — M129 Arthropathy, unspecified: Secondary | ICD-10-CM | POA: Diagnosis not present

## 2019-12-06 ENCOUNTER — Ambulatory Visit: Payer: BC Managed Care – PPO | Admitting: Internal Medicine

## 2019-12-06 ENCOUNTER — Other Ambulatory Visit: Payer: Self-pay

## 2019-12-06 ENCOUNTER — Encounter: Payer: Self-pay | Admitting: Internal Medicine

## 2019-12-06 VITALS — BP 150/80 | HR 82 | Ht 64.5 in | Wt 225.0 lb

## 2019-12-06 DIAGNOSIS — E038 Other specified hypothyroidism: Secondary | ICD-10-CM

## 2019-12-06 DIAGNOSIS — E063 Autoimmune thyroiditis: Secondary | ICD-10-CM | POA: Diagnosis not present

## 2019-12-06 LAB — T4, FREE: Free T4: 1.09 ng/dL (ref 0.60–1.60)

## 2019-12-06 LAB — TSH: TSH: 1.61 u[IU]/mL (ref 0.35–4.50)

## 2019-12-06 MED ORDER — SYNTHROID 100 MCG PO TABS: 100.0000 ug | ORAL_TABLET | Freq: Every day | ORAL | 3 refills | Status: DC

## 2019-12-06 NOTE — Progress Notes (Signed)
Patient ID: Hannah Good, female   DOB: [birth year], 54 y.o.   MRN: [id number]  This visit occurred during the SARS-CoV-2 public health emergency.  Safety protocols were in place, including screening questions prior to the visit, additional usage of staff PPE, and extensive cleaning of exam room while observing appropriate contact time as indicated for disinfecting solutions.   HPI  Hannah Good is a 54 y.o.-year-old female, returning for f/u for hypothyroidism 2/2 Hashimoto's thyroiditis. Last visit 1 year ago.  Since last visit, she contacted me with joint pain and swelling.  I recommended a rheumatology consult. She has this coming up at the end of the month. She started Mobic >> pain a little better.  She was diagnosed with hypothyroidism ~in 1981.  She is on Synthroid d.a.w. 100 mcg daily, taken: - in am - fasting - at least 30 min from b'fast - no Ca, Fe, MVI, PPIs - not on Biotin  Reviewed patient's TFTs: Lab Results  Component Value Date   TSH 3.63 05/16/2019   TSH 4.10 01/23/2019   TSH 5.57 (H) 12/06/2018   TSH 0.07 (L) 01/09/2018   TSH 0.07 (L) 12/05/2017   FREET4 1.04 05/16/2019   FREET4 1.03 01/23/2019   FREET4 0.94 12/06/2018   FREET4 1.16 01/09/2018   FREET4 1.27 12/05/2017  - per records reviewed from PCP: 10/03/2013: TSH 0.769, fT4 1.17 08/31/2012: TSH 1.329, fT4 1.12  She had positive TPO antibodies: Component     Latest Ref Rng 03/11/2014  Thyroid Peroxidase Antibody     <9 IU/mL 211 (H)   Pt denies: - feeling nodules in neck - hoarseness - choking - SOB with lying down But she has occasional dysphagia with meat or other foods and sometimes even without foods. Worse with drier foods.   She has + FH of thyroid disorders in: mother.  + Family history of thyroid cancer in niece.   No h/o radiation tx to head or neck.  No herbal supplements. No Biotin use. No recent steroids use.   She also has a history of anxiety.  She was previously on the  next 56 days diet, on which she lost more than 15 pounds.  However, she came off the diet and gained a significant amount of weight afterwards.  In 2018, she was weighing 177 lbs >> now 225 lbs.  ROS: Constitutional: no weight gain/no weight loss, no fatigue, no subjective hyperthermia, no subjective hypothermia Eyes: no blurry vision, no xerophthalmia ENT: no sore throat, no nodules palpated in neck, no dysphagia, no odynophagia, no hoarseness Cardiovascular: no CP/no SOB/no palpitations/no leg swelling Respiratory: no cough/no SOB/no wheezing Gastrointestinal: no N/no V/no D/no C/no acid reflux Musculoskeletal: no muscle aches/+ joint aches Skin: no rashes, no hair loss Neurological: no tremors/no numbness/no tingling/no dizziness  I reviewed pt's medications, allergies, PMH, social hx, family hx, and changes were documented in the history of present illness. Otherwise, unchanged from my initial visit note.  Past Medical History:  Diagnosis Date  . Anxiety   . Thyroid disease    Past Surgical History:  Procedure Laterality Date  . CESAREAN SECTION     3  . HERNIA REPAIR  2015   History   Occupational History  . homemaker   Social History Main Topics  . Smoking status: Never Smoker   . Smokeless tobacco: Not on file  . Alcohol Use: ! Glass wine per mo  . Drug Use: No  Exercises on treadmill 3x a week  Social History Narrative  Married: 3 children   Current Outpatient Medications  Medication Sig Dispense Refill  . desvenlafaxine (PRISTIQ) 50 MG 24 hr tablet Take 50 mg by mouth at bedtime.    Marland Kitchen loratadine (CLARITIN) 10 MG tablet Take 10 mg by mouth daily.    Marland Kitchen LORazepam (ATIVAN) 0.5 MG tablet Take 0.5 mg by mouth as needed for anxiety.    . meloxicam (MOBIC) 15 MG tablet     . montelukast (SINGULAIR) 10 MG tablet Take 10 mg by mouth daily.    Marland Kitchen SYNTHROID 100 MCG tablet Take 1 tablet (100 mcg total) by mouth daily before breakfast. 45 tablet 2   No current  facility-administered medications for this visit.    NKDA   Family History  Problem Relation Age of Onset  . Chronic fatigue Mother   . Fibromyalgia Mother   . Cancer Father        leukemia  . Heart disease Father    PE: BP (!) 150/80   Pulse 82   Ht 5' 4.5" (1.638 m)   Wt 225 lb (102.1 kg)   SpO2 98%   BMI 38.02 kg/m  Body mass index is 38.02 kg/m. Wt Readings from Last 3 Encounters:  12/06/19 225 lb (102.1 kg)  12/06/18 232 lb (105.2 kg)  12/05/17 218 lb 9.6 oz (99.2 kg)   Constitutional: overweight, in NAD Eyes: PERRLA, EOMI, no exophthalmos ENT: moist mucous membranes, no thyromegaly, no cervical lymphadenopathy Cardiovascular: RRR, No MRG Respiratory: CTA B Gastrointestinal: abdomen soft, NT, ND, BS+ Musculoskeletal: no deformities, strength intact in all 4 Skin: moist, warm, no rashes Neurological: no tremor with outstretched hands, DTR normal in all 4  ASSESSMENT: 1. Hypothyroidism 2/2 Hashimoto's thyroiditis  PLAN:  1.  Patient with history of hypothyroidism due to Hashimoto's thyroiditis - latest thyroid labs reviewed with pt >> normal: Lab Results  Component Value Date   TSH 3.63 05/16/2019   - she continues on Synthroid d.a.w. 100 mcg daily - pt feels good on this dose, without complaints other than joint pains, which will be evaluated by rheumatology at the end of the month. - we discussed about taking the thyroid hormone every day, with water, >30 minutes before breakfast, separated by >4 hours from acid reflux medications, calcium, iron, multivitamins. Pt. is taking it correctly. - will check thyroid tests today: TSH and fT4 - If labs are abnormal, she will need to return for repeat TFTs in 1.5 months - OTW, I will see her back in 1 year.  Needs refills.  Component     Latest Ref Rng & Units 12/06/2019  T4,Free(Direct)     0.60 - 1.60 ng/dL 7.49  TSH     4.49 - 6.75 uIU/mL 1.61  TFTs are normal.  We will refill her Synthroid  prescription.  Carlus Pavlov, MD PhD [facility] Endocrinology

## 2019-12-06 NOTE — Patient Instructions (Signed)
Continue Synthroid 100 mcg daily.  Take the thyroid hormone every day, with water, at least 30 minutes before breakfast, separated by at least 4 hours from: - acid reflux medications - calcium - iron - multivitamins  Please stop at the lab.  Please return in 1 year.  

## 2019-12-20 DIAGNOSIS — M2550 Pain in unspecified joint: Secondary | ICD-10-CM | POA: Diagnosis not present

## 2020-02-15 DIAGNOSIS — Z1231 Encounter for screening mammogram for malignant neoplasm of breast: Secondary | ICD-10-CM | POA: Diagnosis not present

## 2020-02-15 DIAGNOSIS — Z01419 Encounter for gynecological examination (general) (routine) without abnormal findings: Secondary | ICD-10-CM | POA: Diagnosis not present

## 2020-02-19 DIAGNOSIS — J0100 Acute maxillary sinusitis, unspecified: Secondary | ICD-10-CM | POA: Diagnosis not present

## 2020-04-14 DIAGNOSIS — Z1231 Encounter for screening mammogram for malignant neoplasm of breast: Secondary | ICD-10-CM | POA: Diagnosis not present

## 2020-04-22 DIAGNOSIS — N3091 Cystitis, unspecified with hematuria: Secondary | ICD-10-CM | POA: Diagnosis not present

## 2020-04-22 DIAGNOSIS — N3001 Acute cystitis with hematuria: Secondary | ICD-10-CM | POA: Diagnosis not present

## 2020-06-11 DIAGNOSIS — Z20828 Contact with and (suspected) exposure to other viral communicable diseases: Secondary | ICD-10-CM | POA: Diagnosis not present

## 2020-06-11 DIAGNOSIS — J019 Acute sinusitis, unspecified: Secondary | ICD-10-CM | POA: Diagnosis not present

## 2020-10-17 DIAGNOSIS — S83242A Other tear of medial meniscus, current injury, left knee, initial encounter: Secondary | ICD-10-CM | POA: Diagnosis not present

## 2020-10-20 DIAGNOSIS — M25562 Pain in left knee: Secondary | ICD-10-CM | POA: Diagnosis not present

## 2020-10-23 DIAGNOSIS — M1712 Unilateral primary osteoarthritis, left knee: Secondary | ICD-10-CM | POA: Diagnosis not present

## 2020-11-25 ENCOUNTER — Encounter: Payer: Self-pay | Admitting: Internal Medicine

## 2020-11-25 ENCOUNTER — Ambulatory Visit: Payer: BC Managed Care – PPO | Admitting: Internal Medicine

## 2020-11-25 ENCOUNTER — Other Ambulatory Visit: Payer: Self-pay

## 2020-11-25 VITALS — BP 120/88 | HR 84 | Ht 64.5 in | Wt 214.8 lb

## 2020-11-25 DIAGNOSIS — E063 Autoimmune thyroiditis: Secondary | ICD-10-CM

## 2020-11-25 DIAGNOSIS — E038 Other specified hypothyroidism: Secondary | ICD-10-CM

## 2020-11-25 LAB — T4, FREE: Free T4: 0.87 ng/dL (ref 0.60–1.60)

## 2020-11-25 LAB — TSH: TSH: 6.01 u[IU]/mL — ABNORMAL HIGH (ref 0.35–4.50)

## 2020-11-25 NOTE — Patient Instructions (Signed)
Continue Synthroid 100 mcg daily.  Take the thyroid hormone every day, with water, at least 30 minutes before breakfast, separated by at least 4 hours from: - acid reflux medications - calcium - iron - multivitamins  Please stop at the lab.  Please return in 1 year.

## 2020-11-25 NOTE — Progress Notes (Signed)
Patient ID: Hannah Good, female   DOB: [birth year], 55 y.o.   MRN: [id number]  This visit occurred during the SARS-CoV-2 public health emergency.  Safety protocols were in place, including screening questions prior to the visit, additional usage of staff PPE, and extensive cleaning of exam room while observing appropriate contact time as indicated for disinfecting solutions.   HPI  [name] is a 55 y.o.-year-old female, returning for f/u for hypothyroidism 2/2 Hashimoto's thyroiditis. Last visit 1 year ago.  Interim history: She is off and on Keto diet >> lost 11 lbs. She has B knee pain (OA) - may need steroid inj.'s.  She was diagnosed with hypothyroidism ~in 1981.  She is on Synthroid d.a.w. 100 mcg daily, taken: - in am - fasting - at least 45 min from b'fast and coffee + creamer - no Ca, Fe, MVI, PPIs - not on Biotin  Reviewed patient's TFTs: Lab Results  Component Value Date   TSH 1.61 12/06/2019   TSH 3.63 05/16/2019   TSH 4.10 01/23/2019   TSH 5.57 (H) 12/06/2018   TSH 0.07 (L) 01/09/2018   FREET4 1.09 12/06/2019   FREET4 1.04 05/16/2019   FREET4 1.03 01/23/2019   FREET4 0.94 12/06/2018   FREET4 1.16 01/09/2018  - per records reviewed from PCP: 10/03/2013: TSH 0.769, fT4 1.17 08/31/2012: TSH 1.329, fT4 1.12  She had positive TPO antibodies: Component     Latest Ref Rng 03/11/2014  Thyroid Peroxidase Antibody     <9 IU/mL 211 (H)   Pt denies: - feeling nodules in neck - hoarseness - choking - SOB with lying down But she has occasional dysphagia with meat or other foods and sometimes even without foods. Worse with drier foods.   She has + FH of thyroid disorders in: mother.  + Family history of thyroid cancer in niece.   No h/o radiation tx to head or neck.  No herbal supplements. No Biotin use. No recent steroids use.   She also has a history of anxiety.  She was previously on the next 56 days diet, on which she lost more than 15 pounds.   However, she came off the diet and gained a significant amount of weight afterwards (from 177 lbs on 2018).  ROS: Constitutional: no weight gain/+ weight loss, no fatigue, no subjective hyperthermia, no subjective hypothermia Eyes: no blurry vision, no xerophthalmia ENT: no sore throat, + see HPI Cardiovascular: no CP/no SOB/no palpitations/no leg swelling Respiratory: no cough/no SOB/no wheezing Gastrointestinal: no N/no V/no D/no C/no acid reflux Musculoskeletal: no muscle aches/+ joint aches Skin: no rashes, no hair loss Neurological: no tremors/no numbness/no tingling/no dizziness  I reviewed pt's medications, allergies, PMH, social hx, family hx, and changes were documented in the history of present illness. Otherwise, unchanged from my initial visit note.  Past Medical History:  Diagnosis Date  . Anxiety   . Thyroid disease    Past Surgical History:  Procedure Laterality Date  . CESAREAN SECTION     3  . HERNIA REPAIR  2015   History   Occupational History  . homemaker   Social History Main Topics  . Smoking status: Never Smoker   . Smokeless tobacco: Not on file  . Alcohol Use: ! Glass wine per mo  . Drug Use: No  Exercises on treadmill 3x a week  Social History Narrative   Married: 3 children   Current Outpatient Medications  Medication Sig Dispense Refill  . desvenlafaxine (PRISTIQ) 50 MG 24 hr tablet  Take 50 mg by mouth at bedtime.    Marland Kitchen loratadine (CLARITIN) 10 MG tablet Take 10 mg by mouth daily.    Marland Kitchen LORazepam (ATIVAN) 0.5 MG tablet Take 0.5 mg by mouth as needed for anxiety.    . meloxicam (MOBIC) 15 MG tablet     . montelukast (SINGULAIR) 10 MG tablet Take 10 mg by mouth daily.    Marland Kitchen SYNTHROID 100 MCG tablet Take 1 tablet (100 mcg total) by mouth daily before breakfast. 90 tablet 3   No current facility-administered medications for this visit.    NKDA   Family History  Problem Relation Age of Onset  . Chronic fatigue Mother   . Fibromyalgia  Mother   . Cancer Father        leukemia  . Heart disease Father    PE: BP 120/88 (BP Location: Right Arm, Patient Position: Sitting, Cuff Size: Normal)   Pulse 84   Ht 5' 4.5" (1.638 m)   Wt 214 lb 12.8 oz (97.4 kg)   SpO2 98%   BMI 36.30 kg/m  Body mass index is 36.3 kg/m. Wt Readings from Last 3 Encounters:  11/25/20 214 lb 12.8 oz (97.4 kg)  12/06/19 225 lb (102.1 kg)  12/06/18 232 lb (105.2 kg)   Constitutional: overweight, in NAD Eyes: PERRLA, EOMI, no exophthalmos ENT: moist mucous membranes, no thyromegaly, no cervical lymphadenopathy Cardiovascular: RRR, No MRG Respiratory: CTA B Gastrointestinal: abdomen soft, NT, ND, BS+ Musculoskeletal: no deformities, strength intact in all 4 Skin: moist, warm, no rashes Neurological: no tremor with outstretched hands, DTR normal in all 4  ASSESSMENT: 1. Hypothyroidism 2/2 Hashimoto's thyroiditis  PLAN:  1.  Patient with history of hypothyroidism due to Hashimoto's thyroiditis. - latest thyroid labs reviewed with pt. >> normal: Lab Results  Component Value Date   TSH 1.61 12/06/2019  - she continues on Synthroid d.a.w. mcg daily - pt feels good on this dose.  She lost approximately 11 pounds since last visit off and on a keto diet.   - we discussed about taking the thyroid hormone every day, with water, >30 minutes before breakfast, separated by >4 hours from acid reflux medications, calcium, iron, multivitamins. Pt. is taking it correctly. - will check thyroid tests today: TSH and fT4 - If labs are abnormal, she will need to return for repeat TFTs in 1.5 months - OTW, I will see her back in a year  Needs refills.   Component     Latest Ref Rng & Units 11/25/2020  T4,Free(Direct)     0.60 - 1.60 ng/dL 4.17  TSH     4.08 - 1.44 uIU/mL 6.01 (H)  TSH is now slightly elevated, and this is surprising, since she takes the same dose of Synthroid as at last visit when her TFTs were normal and even lost approximately 11 pounds.   I would like to repeat her test in 1.5 months before changing the dose.  Carlus Pavlov, MD PhD [facility] Endocrinology

## 2020-11-26 ENCOUNTER — Encounter: Payer: Self-pay | Admitting: Internal Medicine

## 2020-11-26 MED ORDER — SYNTHROID 100 MCG PO TABS: 100.0000 ug | ORAL_TABLET | Freq: Every day | ORAL | 3 refills | Status: DC

## 2020-12-05 ENCOUNTER — Ambulatory Visit: Payer: BC Managed Care – PPO | Admitting: Internal Medicine

## 2020-12-30 ENCOUNTER — Other Ambulatory Visit: Payer: Self-pay

## 2020-12-30 ENCOUNTER — Other Ambulatory Visit (INDEPENDENT_AMBULATORY_CARE_PROVIDER_SITE_OTHER): Payer: BC Managed Care – PPO

## 2020-12-30 DIAGNOSIS — E063 Autoimmune thyroiditis: Secondary | ICD-10-CM

## 2020-12-30 DIAGNOSIS — E038 Other specified hypothyroidism: Secondary | ICD-10-CM | POA: Diagnosis not present

## 2020-12-30 LAB — TSH: TSH: 3.45 u[IU]/mL (ref 0.35–5.50)

## 2020-12-30 LAB — T4, FREE: Free T4: 1.03 ng/dL (ref 0.60–1.60)

## 2021-01-15 DIAGNOSIS — L304 Erythema intertrigo: Secondary | ICD-10-CM | POA: Diagnosis not present

## 2021-02-16 DIAGNOSIS — Z1231 Encounter for screening mammogram for malignant neoplasm of breast: Secondary | ICD-10-CM | POA: Diagnosis not present

## 2021-02-16 DIAGNOSIS — Z1151 Encounter for screening for human papillomavirus (HPV): Secondary | ICD-10-CM | POA: Diagnosis not present

## 2021-02-16 DIAGNOSIS — Z01419 Encounter for gynecological examination (general) (routine) without abnormal findings: Secondary | ICD-10-CM | POA: Diagnosis not present

## 2021-04-17 DIAGNOSIS — Z1231 Encounter for screening mammogram for malignant neoplasm of breast: Secondary | ICD-10-CM | POA: Diagnosis not present

## 2021-11-24 ENCOUNTER — Other Ambulatory Visit: Payer: Self-pay | Admitting: Internal Medicine

## 2021-11-24 DIAGNOSIS — E038 Other specified hypothyroidism: Secondary | ICD-10-CM

## 2021-12-01 ENCOUNTER — Encounter: Payer: Self-pay | Admitting: Internal Medicine

## 2021-12-01 ENCOUNTER — Ambulatory Visit: Payer: BC Managed Care – PPO | Admitting: Internal Medicine

## 2021-12-01 VITALS — BP 128/79 | HR 73 | Ht 64.0 in | Wt 188.8 lb

## 2021-12-01 DIAGNOSIS — E038 Other specified hypothyroidism: Secondary | ICD-10-CM | POA: Diagnosis not present

## 2021-12-01 DIAGNOSIS — E063 Autoimmune thyroiditis: Secondary | ICD-10-CM | POA: Diagnosis not present

## 2021-12-01 LAB — TSH: TSH: 2.69 u[IU]/mL (ref 0.35–5.50)

## 2021-12-01 LAB — T4, FREE: Free T4: 1.05 ng/dL (ref 0.60–1.60)

## 2021-12-01 MED ORDER — SYNTHROID 100 MCG PO TABS: 100.0000 ug | ORAL_TABLET | Freq: Every day | ORAL | 3 refills | Status: DC

## 2021-12-01 NOTE — Patient Instructions (Signed)
Continue Synthroid 100 mcg daily.  Take the thyroid hormone every day, with water, at least 30 minutes before breakfast, separated by at least 4 hours from: - acid reflux medications - calcium - iron - multivitamins  Please stop at the lab.  Please return in 1 year.

## 2021-12-01 NOTE — Progress Notes (Signed)
Patient ID: [name], female   DOB: May 09, 1966, 56 y.o.   MRN: [id number]  HPI  Hannah Good is a 56 y.o.-year-old female, returning for f/u for hypothyroidism 2/2 Hashimoto's thyroiditis. Last visit 1 year ago.  Interim history: Before last visit, she lost 11 pounds off and on a keto diet. Then started Optavia diet >> lots 26 lbs since last OV. She has joint pains -knee osteoarthritis. No steroid injections - on Meloxicam  - helping.  She was diagnosed with hypothyroidism ~in 57.  She is on Synthroid d.a.w. 100 mcg daily, taken: - in am - fasting - at least 45 min from b'fast and coffee + creamer - no Ca, Fe, MVI, PPIs - not on Biotin  Reviewed patient's TFTs: Lab Results  Component Value Date   TSH 3.45 12/30/2020   TSH 6.01 (H) 11/25/2020   TSH 1.61 12/06/2019   TSH 3.63 05/16/2019   TSH 4.10 01/23/2019   FREET4 1.03 12/30/2020   FREET4 0.87 11/25/2020   FREET4 1.09 12/06/2019   FREET4 1.04 05/16/2019   FREET4 1.03 01/23/2019  - per records reviewed from PCP: 10/03/2013: TSH 0.769, fT4 1.17 08/31/2012: TSH 1.329, fT4 1.12  She had positive TPO antibodies: Component     Latest Ref Rng 03/11/2014  Thyroid Peroxidase Antibody     <9 IU/mL 211 (H)   Pt denies: - feeling nodules in neck - hoarseness - choking But she has occasional dysphagia with meat or other foods and sometimes even without foods. Worse with drier foods.   She has + FH of thyroid disorders in: mother.  + Family history of thyroid cancer in niece.   No h/o radiation tx to head or neck. No herbal supplements. No Biotin use. No recent steroids use.   She also has a history of anxiety. She was previously on the next 56 days diet, on which she lost more than 15 pounds.  However, she came off the diet and gained a significant amount of weight afterwards (from 177 lbs on 2018).  ROS: + see HPI + Joint aches  I reviewed pt's medications, allergies, PMH, social hx, family hx, and changes  were documented in the history of present illness. Otherwise, unchanged from my initial visit note.  Past Medical History:  Diagnosis Date   Anxiety    Thyroid disease    Past Surgical History:  Procedure Laterality Date   CESAREAN SECTION     3   HERNIA REPAIR  2015   History   Occupational History   homemaker   Social History Main Topics   Smoking status: Never Smoker    Smokeless tobacco: Not on file   Alcohol Use: ! Glass wine per mo   Drug Use: No  Exercises on treadmill 3x a week  Social History Narrative   Married: 3 children   Current Outpatient Medications  Medication Sig Dispense Refill   desvenlafaxine (PRISTIQ) 50 MG 24 hr tablet Take 50 mg by mouth at bedtime.     loratadine (CLARITIN) 10 MG tablet Take 10 mg by mouth daily.     LORazepam (ATIVAN) 0.5 MG tablet Take 0.5 mg by mouth as needed for anxiety.     meloxicam (MOBIC) 15 MG tablet      montelukast (SINGULAIR) 10 MG tablet Take 10 mg by mouth daily.     SYNTHROID 100 MCG tablet Take 1 tablet (100 mcg total) by mouth daily before breakfast. 90 tablet 3   No current facility-administered medications for this  visit.    NKDA   Family History  Problem Relation Age of Onset   Chronic fatigue Mother    Fibromyalgia Mother    Cancer Father        leukemia   Heart disease Father    PE: BP 128/79 (BP Location: Left Arm, Patient Position: Sitting, Cuff Size: Normal)   Pulse 73   Ht 5\' 4"  (1.626 m)   Wt 188 lb 12.8 oz (85.6 kg)   SpO2 98%   BMI 32.41 kg/m   Wt Readings from Last 3 Encounters:  12/01/21 188 lb 12.8 oz (85.6 kg)  11/25/20 214 lb 12.8 oz (97.4 kg)  12/06/19 225 lb (102.1 kg)   Constitutional: overweight, in NAD Eyes: EOMI, no exophthalmos ENT: moist mucous membranes, no masses palpated in neck, no cervical lymphadenopathy Cardiovascular: RRR, No MRG Respiratory: CTA B Musculoskeletal: no deformities Skin: moist, warm, no rashes Neurological: no tremor with outstretched  hands  ASSESSMENT: 1. Hypothyroidism 2/2 Hashimoto's thyroiditis  PLAN:  1.  Patient with history of autoimmune hypothyroidism - at last visit, TSH was slightly high, but I asked her to come back in a month to repeat the test and the next TSH was normal: Lab Results  Component Value Date   TSH 3.45 12/30/2020  - she continues on Synthroid d.a.w. 100 mcg daily - pt feels good on this dose. Lost 26 lbs since last OV! We discussed that we may need a decreased Synthroid dose for such a significant weight loss. - we discussed about taking the thyroid hormone every day, with water, >30 minutes before breakfast, separated by >4 hours from acid reflux medications, calcium, iron, multivitamins. Pt. is taking it correctly. - will check thyroid tests today: TSH and fT4 - If labs are abnormal, she will need to return for repeat TFTs in 1.5 months - I will have her back for another visit in a year  Needs refills.   Component     Latest Ref Rng 12/01/2021  TSH     0.35 - 5.50 uIU/mL 2.69   T4,Free(Direct)     0.60 - 1.60 ng/dL 01/31/2022    Normal TFTs.  1.01, MD PhD [facility] Endocrinology

## 2022-08-03 ENCOUNTER — Other Ambulatory Visit: Payer: Self-pay | Admitting: Physician Assistant

## 2022-08-03 ENCOUNTER — Other Ambulatory Visit: Payer: Self-pay

## 2022-08-03 DIAGNOSIS — R1032 Left lower quadrant pain: Secondary | ICD-10-CM

## 2022-08-04 ENCOUNTER — Ambulatory Visit
Admission: RE | Admit: 2022-08-04 | Discharge: 2022-08-04 | Disposition: A | Discharge status: Home / Self Care | Payer: BC Managed Care – PPO | Source: Ambulatory Visit | Attending: Physician Assistant | Admitting: Physician Assistant

## 2022-08-04 DIAGNOSIS — R1032 Left lower quadrant pain: Secondary | ICD-10-CM

## 2022-08-04 MED ORDER — IOPAMIDOL (ISOVUE-370) INJECTION 76%
80.0000 mL | Freq: Once | INTRAVENOUS | Status: AC | PRN
  Administered 2022-08-04: 80 mL via INTRAVENOUS

## 2022-08-10 ENCOUNTER — Other Ambulatory Visit: Payer: BC Managed Care – PPO

## 2022-09-15 ENCOUNTER — Other Ambulatory Visit: Payer: Self-pay | Admitting: Urology

## 2022-09-22 ENCOUNTER — Encounter (HOSPITAL_BASED_OUTPATIENT_CLINIC_OR_DEPARTMENT_OTHER): Payer: Self-pay | Admitting: Urology

## 2022-09-22 NOTE — Progress Notes (Signed)
Talked with patient. Instructions given. Arrival time 0600 npo after MN.  Husband is the driver. Hx and meds reviewed. Bring blue folder in

## 2022-09-27 ENCOUNTER — Ambulatory Visit (HOSPITAL_COMMUNITY): Payer: BC Managed Care – PPO

## 2022-09-27 ENCOUNTER — Encounter (HOSPITAL_BASED_OUTPATIENT_CLINIC_OR_DEPARTMENT_OTHER)
Admission: RE | Disposition: A | Discharge status: Home / Self Care | Payer: Self-pay | Source: Home / Self Care | Attending: Urology

## 2022-09-27 ENCOUNTER — Ambulatory Visit (HOSPITAL_BASED_OUTPATIENT_CLINIC_OR_DEPARTMENT_OTHER)
Admission: RE | Admit: 2022-09-27 | Discharge: 2022-09-27 | Disposition: A | Discharge status: Home / Self Care | Payer: BC Managed Care – PPO | Attending: Urology | Admitting: Urology

## 2022-09-27 ENCOUNTER — Encounter (HOSPITAL_BASED_OUTPATIENT_CLINIC_OR_DEPARTMENT_OTHER): Payer: Self-pay | Admitting: Urology

## 2022-09-27 DIAGNOSIS — E039 Hypothyroidism, unspecified: Secondary | ICD-10-CM | POA: Diagnosis not present

## 2022-09-27 DIAGNOSIS — N200 Calculus of kidney: Secondary | ICD-10-CM | POA: Diagnosis present

## 2022-09-27 DIAGNOSIS — Z01818 Encounter for other preprocedural examination: Secondary | ICD-10-CM

## 2022-09-27 HISTORY — DX: Hypothyroidism, unspecified: E03.9

## 2022-09-27 HISTORY — PX: EXTRACORPOREAL SHOCK WAVE LITHOTRIPSY: SHX1557

## 2022-09-27 HISTORY — DX: Unspecified osteoarthritis, unspecified site: M19.90

## 2022-09-27 SURGERY — LITHOTRIPSY, ESWL
Anesthesia: LOCAL | Laterality: Right

## 2022-09-27 MED ORDER — SODIUM CHLORIDE 0.9 % IV SOLN: INTRAVENOUS | Status: DC

## 2022-09-27 MED ORDER — CIPROFLOXACIN HCL 500 MG PO TABS
500.0000 mg | ORAL_TABLET | ORAL | Status: AC
  Administered 2022-09-27: 500 mg via ORAL

## 2022-09-27 MED ORDER — DIPHENHYDRAMINE HCL 25 MG PO CAPS
25.0000 mg | ORAL_CAPSULE | ORAL | Status: AC
  Administered 2022-09-27: 25 mg via ORAL

## 2022-09-27 MED ORDER — OXYCODONE-ACETAMINOPHEN 5-325 MG PO TABS: 1.0000 | ORAL_TABLET | ORAL | 0 refills | Status: DC | PRN

## 2022-09-27 MED ORDER — DIAZEPAM 5 MG PO TABS
ORAL_TABLET | ORAL | Status: AC
  Filled 2022-09-27: qty 2

## 2022-09-27 MED ORDER — DOCUSATE SODIUM 100 MG PO CAPS: 100.0000 mg | ORAL_CAPSULE | Freq: Every day | ORAL | 0 refills | Status: DC | PRN

## 2022-09-27 MED ORDER — DIPHENHYDRAMINE HCL 25 MG PO CAPS
ORAL_CAPSULE | ORAL | Status: AC
  Filled 2022-09-27: qty 1

## 2022-09-27 MED ORDER — CIPROFLOXACIN HCL 500 MG PO TABS
ORAL_TABLET | ORAL | Status: AC
  Filled 2022-09-27: qty 1

## 2022-09-27 MED ORDER — DIAZEPAM 5 MG PO TABS
10.0000 mg | ORAL_TABLET | ORAL | Status: AC
  Administered 2022-09-27: 10 mg via ORAL

## 2022-09-27 MED ORDER — ONDANSETRON HCL 4 MG PO TABS: 4.0000 mg | ORAL_TABLET | Freq: Every day | ORAL | 0 refills | Status: DC | PRN

## 2022-09-27 NOTE — Discharge Instructions (Addendum)
Activity:  You are encouraged to ambulate frequently (about every hour during waking hours) to help prevent blood clots from forming in your legs or lungs.   ? ?Diet: You should advance your diet as instructed by your physician.  It will be normal to have some bloating, nausea, and abdominal discomfort intermittently. ? ?Prescriptions:  You will be provided a prescription for pain medication to take as needed.  If your pain is not severe enough to require the prescription pain medication, you may take extra strength Tylenol instead which will have less side effects.  You should also take a prescribed stool softener to avoid straining with bowel movements as the prescription pain medication may constipate you. ? ?What to call us about: You should call the office ([phone]) if you develop fever > 101 or develop persistent vomiting. Activity:  You are encouraged to ambulate frequently (about every hour during waking hours) to help prevent blood clots from forming in your legs or lungs.   ?Post Anesthesia Home Care Instructions ? ?Activity: ?Get plenty of rest for the remainder of the day. A responsible adult should stay with you for 24 hours following the procedure.  ?For the next 24 hours, DO NOT: ?-Drive a car ?-Operate machinery ?-Drink alcoholic beverages ?-Take any medication unless instructed by your physician ?-Make any legal decisions or sign important papers. ? ?Meals: ?Start with liquid foods such as gelatin or soup. Progress to regular foods as tolerated. Avoid greasy, spicy, heavy foods. If nausea and/or vomiting occur, drink only clear liquids until the nausea and/or vomiting subsides. Call your physician if vomiting continues. ? ?Special Instructions/Symptoms: ?Your throat may feel dry or sore from the anesthesia or the breathing tube placed in your throat during surgery. If this causes discomfort, gargle with warm salt water. The discomfort should disappear within 24 hours. ? ? ? ?   ?

## 2022-09-27 NOTE — H&P (Signed)
Urology Preoperative H&P   Chief Complaint: Right renal stone  History of Present Illness: Hannah Good is a 57 y.o. female with a right renal stone here for right ESWL. Denies fevers, chills, dysuria.   Past Medical History:  Diagnosis Date   Anxiety    Arthritis    Hypothyroidism    Thyroid disease     Past Surgical History:  Procedure Laterality Date   CESAREAN SECTION     3   HERNIA REPAIR  2015    Allergies: No Known Allergies  Family History  Problem Relation Age of Onset   Chronic fatigue Mother    Fibromyalgia Mother    Cancer Father        leukemia   Heart disease Father     Social History:  reports that she has never smoked. She has never used smokeless tobacco. She reports that she does not drink alcohol and does not use drugs.  ROS: A complete review of systems was performed.  All systems are negative except for pertinent findings as noted.  Physical Exam:  Vital signs in last 24 hours: Temp:  [98.2 F (36.8 C)] 98.2 F (36.8 C) (04/01 0640) Pulse Rate:  [86] 86 (04/01 0640) Resp:  [16] 16 (04/01 0640) BP: (142)/(93) 142/93 (04/01 0640) SpO2:  [98 %] 98 % (04/01 0640) Weight:  [101.3 kg] 101.3 kg (04/01 0640) Constitutional:  Alert and oriented, No acute distress Cardiovascular: Regular rate and rhythm Respiratory: Normal respiratory effort, Lungs clear bilaterally GI: Abdomen is soft, nontender, nondistended, no abdominal masses GU: No CVA tenderness Lymphatic: No lymphadenopathy Neurologic: Grossly intact, no focal deficits Psychiatric: Normal mood and affect  Laboratory Data:  No results for input(s): "WBC", "HGB", "HCT", "PLT" in the last 72 hours.  No results for input(s): "NA", "K", "CL", "GLUCOSE", "BUN", "CALCIUM", "CREATININE" in the last 72 hours.  Invalid input(s): "CO3"   No results found for this or any previous visit (from the past 24 hour(s)). No results found for this or any previous visit (from the past 240  hour(s)).  Renal Function: No results for input(s): "CREATININE" in the last 168 hours. CrCl cannot be calculated (No successful lab value found.).  Radiologic Imaging: DG Abd 1 View  Result Date: 09/27/2022 CLINICAL DATA:  Nephrolithiasis. EXAM: ABDOMEN - 1 VIEW COMPARISON:  08/03/2022. FINDINGS: The bowel gas pattern is normal. 1.8 cm calcific density overlies the right kidney. This stone appears larger than on the prior study. IMPRESSION: Right-sided nephrolithiasis. Electronically Signed   By: Sammie Bench M.D.   On: 09/27/2022 07:47    I independently reviewed the above imaging studies.  Assessment and Plan Hannah Good is a 57 y.o. female with a right renal stone here for right ESWL.   The risks, benefits and alternatives of right ESWL was discussed with the patient. I described the risks which include arrhythmia, kidney contusion, kidney hemorrhage, need for transfusion, back discomfort, flank ecchymosis, flank abrasion, inability to fracture the stone, inability to pass stone fragments, Steinstrasse, infection associated with obstructing stones, need for an alternative surgical procedure and possible need for repeat shockwave lithotripsy.  The patient voices understanding and wishes to proceed.      Matt R. Pebbles Zeiders MD 09/27/2022, 8:01 AM  [facility] Pager: [phone]): [phone]

## 2022-09-27 NOTE — Op Note (Signed)
[facility] Operative Note  Treating Physician: Jailani Hogans, MD  Pre-op diagnosis: Right renal stone  Post-op diagnosis: Same   Procedure: Right ESWL  See [facility] OP note scanned into chart. Also because of the size, density, location and other factors that cannot be anticipated I feel this will likely be a staged procedure. This fact supersedes any indication in the scanned [facility] operative note to the contrary.  Matt R. Carolan Avedisian MD [facility]  Pager: [phone]   

## 2022-09-28 ENCOUNTER — Encounter (HOSPITAL_BASED_OUTPATIENT_CLINIC_OR_DEPARTMENT_OTHER): Payer: Self-pay | Admitting: Urology

## 2022-11-19 ENCOUNTER — Other Ambulatory Visit: Payer: Self-pay | Admitting: Internal Medicine

## 2022-11-19 DIAGNOSIS — E038 Other specified hypothyroidism: Secondary | ICD-10-CM

## 2022-11-23 ENCOUNTER — Other Ambulatory Visit: Payer: Self-pay | Admitting: Orthopaedic Surgery

## 2022-11-23 DIAGNOSIS — S83249A Other tear of medial meniscus, current injury, unspecified knee, initial encounter: Secondary | ICD-10-CM

## 2022-12-07 ENCOUNTER — Other Ambulatory Visit: Payer: BC Managed Care – PPO

## 2022-12-14 ENCOUNTER — Ambulatory Visit: Payer: BC Managed Care – PPO | Admitting: Internal Medicine

## 2022-12-14 ENCOUNTER — Encounter: Payer: Self-pay | Admitting: Internal Medicine

## 2022-12-14 VITALS — BP 118/68 | HR 98 | Ht 64.0 in | Wt 229.0 lb

## 2022-12-14 DIAGNOSIS — E063 Autoimmune thyroiditis: Secondary | ICD-10-CM | POA: Diagnosis not present

## 2022-12-14 DIAGNOSIS — E038 Other specified hypothyroidism: Secondary | ICD-10-CM | POA: Diagnosis not present

## 2022-12-14 LAB — T4, FREE: Free T4: 1.28 ng/dL (ref 0.60–1.60)

## 2022-12-14 LAB — TSH: TSH: 1.05 u[IU]/mL (ref 0.35–5.50)

## 2022-12-14 MED ORDER — SYNTHROID 100 MCG PO TABS: 100.0000 ug | ORAL_TABLET | Freq: Every day | ORAL | 3 refills | Status: DC

## 2022-12-14 NOTE — Progress Notes (Signed)
Patient ID: Hannah Good, female   DOB: [birth year], 57 y.o.   MRN: [id number]  HPI  Hannah Good is a 57 y.o.-year-old female, returning for f/u for hypothyroidism 2/2 Hashimoto's thyroiditis. Last visit 1 year ago.  Interim history: Before last visit, she tried the keto diet and also Optavia diet >> lost 26 pounds.  Since then, she gained weight back. She has joint pains -knee osteoarthritis - had surgery on the R knee.  Had 1 steroid injections 07/2022 before surgery.  She stopped meloxicam - started Celebrex.  She still has pain in the R knee.  She also has pain and swelling in the L knee and will have to have surgery. She is in PT.  She was diagnosed with hypothyroidism ~in 1981.  She is on Synthroid d.a.w. 100 mcg daily, taken: - in am - fasting - at least 45 min from b'fast and coffee + creamer - no Ca, Fe, MVI, PPIs - not on Biotin  Reviewed patient's TFTs: Lab Results  Component Value Date   TSH 2.69 12/01/2021   TSH 3.45 12/30/2020   TSH 6.01 (H) 11/25/2020   TSH 1.61 12/06/2019   TSH 3.63 05/16/2019   FREET4 1.05 12/01/2021   FREET4 1.03 12/30/2020   FREET4 0.87 11/25/2020   FREET4 1.09 12/06/2019   FREET4 1.04 05/16/2019  - per records reviewed from PCP: 10/03/2013: TSH 0.769, fT4 1.17 08/31/2012: TSH 1.329, fT4 1.12  She had positive TPO antibodies: Component     Latest Ref Rng 03/11/2014  Thyroid Peroxidase Antibody     <9 IU/mL 211 (H)   Pt denies: - feeling nodules in neck - hoarseness - choking But she has occasional dysphagia with meat or other foods and sometimes even without foods. Worse with drier foods.   She has + FH of thyroid disorders in: mother.  + Family history of thyroid cancer in niece.   No h/o radiation tx to head or neck. No herbal supplements. No Biotin use. No recent steroids use.   She also has a history of anxiety. She was previously on the next 56 days diet, on which she lost more than 15 pounds.  However, she came off the  diet and gained a significant amount of weight afterwards (from 177 lbs on 2018).  ROS: + see HPI + Joint aches  I reviewed pt's medications, allergies, PMH, social hx, family hx, and changes were documented in the history of present illness. Otherwise, unchanged from my initial visit note.  Past Medical History:  Diagnosis Date   Anxiety    Arthritis    Hypothyroidism    Thyroid disease    Past Surgical History:  Procedure Laterality Date   CESAREAN SECTION     3   EXTRACORPOREAL SHOCK WAVE LITHOTRIPSY Right 09/27/2022   Procedure: RIGHT EXTRACORPOREAL SHOCK WAVE LITHOTRIPSY ([facility]);  Surgeon: Jannifer Hick, MD;  Location: [facility];  Service: Urology;  Laterality: Right;   HERNIA REPAIR  2015   History   Occupational History   homemaker   Social History Main Topics   Smoking status: Never Smoker    Smokeless tobacco: Not on file   Alcohol Use: ! Glass wine per mo   Drug Use: No  Exercises on treadmill 3x a week  Social History Narrative   Married: 3 children   Current Outpatient Medications  Medication Sig Dispense Refill   desvenlafaxine (PRISTIQ) 50 MG 24 hr tablet Take 50 mg by mouth at bedtime.  docusate sodium (COLACE) 100 MG capsule Take 1 capsule (100 mg total) by mouth daily as needed for up to 30 doses. 30 capsule 0   loratadine (CLARITIN) 10 MG tablet Take 10 mg by mouth daily.     LORazepam (ATIVAN) 0.5 MG tablet Take 0.5 mg by mouth as needed for anxiety.     meloxicam (MOBIC) 15 MG tablet      montelukast (SINGULAIR) 10 MG tablet Take 10 mg by mouth daily.     ondansetron (ZOFRAN) 4 MG tablet Take 1 tablet (4 mg total) by mouth daily as needed for up to 30 doses for nausea or vomiting. 30 tablet 0   oxyCODONE-acetaminophen (PERCOCET) 5-325 MG tablet Take 1 tablet by mouth every 4 (four) hours as needed for up to 18 doses for severe pain. 18 tablet 0   SYNTHROID 100 MCG tablet Take 1 tablet (100 mcg total) by mouth daily before  breakfast. 45 tablet 0   No current facility-administered medications for this visit.   NKDA   Family History  Problem Relation Age of Onset   Chronic fatigue Mother    Fibromyalgia Mother    Cancer Father        leukemia   Heart disease Father    PE: BP 118/68   Pulse 98   Ht 5\' 4"  (1.626 m)   Wt 229 lb (103.9 kg)   SpO2 99%   BMI 39.31 kg/m   Wt Readings from Last 3 Encounters:  12/14/22 229 lb (103.9 kg)  09/27/22 223 lb 4.8 oz (101.3 kg)  12/01/21 188 lb 12.8 oz (85.6 kg)   Constitutional: overweight, in NAD Eyes: EOMI, no exophthalmos ENT: no masses palpated in neck, no cervical lymphadenopathy Cardiovascular: RRR, No MRG Respiratory: CTA B Musculoskeletal: no deformities Skin: no rashes Neurological: no tremor with outstretched hands  ASSESSMENT: 1. Hypothyroidism 2/2 Hashimoto's thyroiditis  PLAN:  1.  Patient with history of autoimmune hypothyroidism - latest thyroid labs reviewed with pt. >> normal: Lab Results  Component Value Date   TSH 2.69 12/01/2021  - she continues on LT4 100 mcg daily (Synthroid d.a.w.) - pt feels good on this dose.  Before last visit, she intentionally lost 26 pounds, however, we did not have to decrease the dose of her Synthroid after forwards.  She gained back 40 pounds afterwards and this may impact her Synthroid requirements. - we discussed about taking the thyroid hormone every day, with water, >30 minutes before breakfast, separated by >4 hours from acid reflux medications, calcium, iron, multivitamins. Pt. is taking it correctly. - will check thyroid tests today: TSH and fT4 - If labs are abnormal, she will need to return for repeat TFTs in 1.5 months - OTW, I will have her come back in a year  Needs refills.  Component     Latest Ref Rng 12/14/2022  TSH     0.35 - 5.50 uIU/mL 1.05   T4,Free(Direct)     0.60 - 1.60 ng/dL 1.61   Normal TFTs.   Carlus Pavlov, MD PhD [facility] End[facility]

## 2022-12-14 NOTE — Patient Instructions (Signed)
Continue Synthroid 100 mcg daily.  Take the thyroid hormone every day, with water, at least 30 minutes before breakfast, separated by at least 4 hours from: - acid reflux medications - calcium - iron - multivitamins  Please stop at the lab.  Please return in 1 year.  

## 2022-12-19 ENCOUNTER — Other Ambulatory Visit: Payer: BC Managed Care – PPO

## 2023-05-03 ENCOUNTER — Other Ambulatory Visit: Payer: Self-pay | Admitting: Internal Medicine

## 2023-05-03 DIAGNOSIS — E063 Autoimmune thyroiditis: Secondary | ICD-10-CM

## 2023-06-02 ENCOUNTER — Other Ambulatory Visit: Payer: Self-pay | Admitting: Internal Medicine

## 2023-06-02 DIAGNOSIS — E063 Autoimmune thyroiditis: Secondary | ICD-10-CM

## 2023-11-25 ENCOUNTER — Other Ambulatory Visit: Payer: Self-pay | Admitting: Internal Medicine

## 2023-11-25 DIAGNOSIS — E063 Autoimmune thyroiditis: Secondary | ICD-10-CM

## 2023-12-14 ENCOUNTER — Ambulatory Visit: Payer: BC Managed Care – PPO | Admitting: Internal Medicine

## 2023-12-14 VITALS — BP 120/70 | HR 86 | Ht 64.0 in | Wt 223.0 lb

## 2023-12-14 DIAGNOSIS — E063 Autoimmune thyroiditis: Secondary | ICD-10-CM

## 2023-12-14 LAB — TSH: TSH: 1.49 m[IU]/L (ref 0.40–4.50)

## 2023-12-14 LAB — T4, FREE: Free T4: 1.5 ng/dL (ref 0.8–1.8)

## 2023-12-14 NOTE — Patient Instructions (Signed)
Continue Synthroid 100 mcg daily.  Take the thyroid hormone every day, with water, at least 30 minutes before breakfast, separated by at least 4 hours from: - acid reflux medications - calcium - iron - multivitamins  Please stop at the lab.  Please return in 1 year.  

## 2023-12-14 NOTE — Progress Notes (Signed)
 Patient ID: Hannah Good, female   DOB: 14-Sep-1965, 58 y.o.   MRN: [id number]  HPI  Hannah Good is a 58 y.o.-year-old female, returning for f/u for hypothyroidism 2/2 Hashimoto's thyroiditis. Last visit 1 year ago.  Interim history: At last visit, she had significant knee pain.  She had right arthroscopic surgery but this did not help so she tried meloxicam and steroid injections - without result.  Since last visit, however, she was able to have a right TKR 05/2023 with excellent results.  At last visit she also mentioned pain in the left knee, which now improved possibly after improving pain in her right knee. Since last visit, she lost 6 pounds.  She previously had large fluctuations in the weight, on the keto diet and also Optavia diet.  She was diagnosed with hypothyroidism ~in 1981.  She is on Synthroid  d.a.w. 100 mcg daily, taken: - in am - fasting - at least 45 min from b'fast and coffee + creamer - no Ca, Fe, MVI, PPIs - not on Biotin  Reviewed patient's TFTs: Lab Results  Component Value Date   TSH 1.05 12/14/2022   TSH 2.69 12/01/2021   TSH 3.45 12/30/2020   TSH 6.01 (H) 11/25/2020   TSH 1.61 12/06/2019   FREET4 1.28 12/14/2022   FREET4 1.05 12/01/2021   FREET4 1.03 12/30/2020   FREET4 0.87 11/25/2020   FREET4 1.09 12/06/2019  - per records reviewed from PCP: 10/03/2013: TSH 0.769, fT4 1.17 08/31/2012: TSH 1.329, fT4 1.12  She had positive TPO antibodies: Component     Latest Ref Rng 03/11/2014  Thyroid  Peroxidase Antibody     <9 IU/mL 211 (H)   Pt denies: - feeling nodules in neck - hoarseness - choking But she has occasional dysphagia with meat or other foods and sometimes even without foods. Worse with drier foods.   She has + FH of thyroid  disorders in: mother.  + Family history of thyroid  cancer in niece.   No h/o radiation tx to head or neck. No herbal supplements. No Biotin use. No recent steroids use.   She also has a history of anxiety. She  was previously on the next 56 days diet, on which she lost more than 15 pounds.  However, she came off the diet and gained a significant amount of weight afterwards (from 177 lbs on 2018).  ROS: + see HPI + Joint aches  I reviewed pt's medications, allergies, PMH, social hx, family hx, and changes were documented in the history of present illness. Otherwise, unchanged from my initial visit note.  Past Medical History:  Diagnosis Date   Anxiety    Arthritis    Hypothyroidism    Thyroid  disease    Past Surgical History:  Procedure Laterality Date   CESAREAN SECTION     3   EXTRACORPOREAL SHOCK WAVE LITHOTRIPSY Right 09/27/2022   Procedure: RIGHT EXTRACORPOREAL SHOCK WAVE LITHOTRIPSY (ESWL);  Surgeon: Lahoma Pigg, MD;  Location: [facility];  Service: Urology;  Laterality: Right;   HERNIA REPAIR  2015   History   Occupational History   homemaker   Social History Main Topics   Smoking status: Never Smoker    Smokeless tobacco: Not on file   Alcohol Use: ! Glass wine per mo   Drug Use: No  Exercises on treadmill 3x a week  Social History Narrative   Married: 3 children   Current Outpatient Medications  Medication Sig Dispense Refill   celecoxib (CELEBREX) 100 MG capsule  Take 100 mg by mouth 2 (two) times daily.     desvenlafaxine (PRISTIQ) 50 MG 24 hr tablet Take 50 mg by mouth at bedtime.     GOODSENSE ASPIRIN 81 MG chewable tablet Chew 81 mg by mouth 2 (two) times daily.     loratadine (CLARITIN) 10 MG tablet Take 10 mg by mouth daily.     LORazepam (ATIVAN) 0.5 MG tablet Take 0.5 mg by mouth as needed for anxiety.     montelukast (SINGULAIR) 10 MG tablet Take 10 mg by mouth daily.     SYNTHROID  100 MCG tablet Take 1 tablet (100 mcg total) by mouth daily before breakfast. 90 tablet 1   No current facility-administered medications for this visit.   NKDA   Family History  Problem Relation Age of Onset   Chronic fatigue Mother    Fibromyalgia Mother     Cancer Father        leukemia   Heart disease Father    PE: BP 120/70   Pulse 86   Ht 5' 4 (1.626 m)   Wt 223 lb (101.2 kg)   SpO2 98%   BMI 38.28 kg/m   Wt Readings from Last 3 Encounters:  12/14/23 223 lb (101.2 kg)  12/14/22 229 lb (103.9 kg)  09/27/22 223 lb 4.8 oz (101.3 kg)   Constitutional: overweight, in NAD, very tanned Eyes: EOMI, no exophthalmos ENT: no masses palpated in neck, no cervical lymphadenopathy Cardiovascular: RRR, No MRG, no LE swelling Respiratory: CTA B Musculoskeletal: no deformities, R TKR healed  Skin: no rashes Neurological: no tremor with outstretched hands  ASSESSMENT: 1. Hypothyroidism 2/2 Hashimoto's thyroiditis  PLAN:  1.  Patient with history of autoimmune hypothyroidism - latest thyroid  labs reviewed with pt. >> normal: Lab Results  Component Value Date   TSH 1.05 12/14/2022  - she continues on Synthroid  d.a.w. 100 mcg daily - pt feels good on this dose.  She gained 40 pounds before last visit, but previously intentionally lost 26 pounds. Since last OV, she lost 6 lbs. - we discussed about taking the thyroid  hormone every day, with water, >30 minutes before breakfast, separated by >4 hours from acid reflux medications, calcium, iron, multivitamins. Pt. is taking it correctly. - will check thyroid  tests today: TSH and fT4 - If labs are abnormal, she will need to return for repeat TFTs in 1.5 months - I will see her back in a year but possibly sooner for labs  Needs refills.  Orders Placed This Encounter  Procedures   TSH   T4, free   Emilie Harden, MD PhD [facility]

## 2023-12-15 ENCOUNTER — Encounter: Payer: Self-pay | Admitting: Internal Medicine

## 2023-12-15 ENCOUNTER — Ambulatory Visit: Payer: Self-pay | Admitting: Internal Medicine

## 2023-12-15 MED ORDER — SYNTHROID 100 MCG PO TABS: 100.0000 ug | ORAL_TABLET | Freq: Every day | ORAL | 2 refills | Status: AC

## 2024-07-08 ENCOUNTER — Telehealth

## 2024-07-08 DIAGNOSIS — J209 Acute bronchitis, unspecified: Secondary | ICD-10-CM

## 2024-07-08 MED ORDER — BENZONATATE 100 MG PO CAPS: 100.0000 mg | ORAL_CAPSULE | Freq: Two times a day (BID) | ORAL | 0 refills | Status: AC | PRN

## 2024-07-08 MED ORDER — PREDNISONE 10 MG (21) PO TBPK: ORAL_TABLET | ORAL | 0 refills | Status: AC

## 2024-07-08 NOTE — Progress Notes (Signed)
 " Virtual Visit Consent   Hannah Good, you are scheduled for a virtual visit with a [facility] provider today. Just as with appointments in the office, your consent must be obtained to participate. Your consent will be active for this visit and any virtual visit you may have with one of our providers in the next 365 days. If you have a MyChart account, a copy of this consent can be sent to you electronically.  As this is a virtual visit, video technology does not allow for your provider to perform a traditional examination. This may limit your provider's ability to fully assess your condition. If your provider identifies any concerns that need to be evaluated in person or the need to arrange testing (such as labs, EKG, etc.), we will make arrangements to do so. Although advances in technology are sophisticated, we cannot ensure that it will always work on either your end or our end. If the connection with a video visit is poor, the visit may have to be switched to a telephone visit. With either a video or telephone visit, we are not always able to ensure that we have a secure connection.  By engaging in this virtual visit, you consent to the provision of healthcare and authorize for your insurance to be billed (if applicable) for the services provided during this visit. Depending on your insurance coverage, you may receive a charge related to this service.  I need to obtain your verbal consent now. Are you willing to proceed with your visit today? Hannah Good has provided verbal consent on 07/08/2024 for a virtual visit (video or telephone). Hannah Learn, FNP  Date: 07/08/2024 9:17 AM   Virtual Visit via Video Note   I, Hannah Good, connected with  Hannah Good  ([id number], 02/19/1966) on 07/08/2024 at  9:15 AM EST by a video-enabled telemedicine application and verified that I am speaking with the correct person using two identifiers.  Location: Patient: Virtual Visit Location  Patient: Home Provider: Virtual Visit Location Provider: Home Office   I discussed the limitations of evaluation and management by telemedicine and the availability of in person appointments. The patient expressed understanding and agreed to proceed.    History of Present Illness: Hannah Good is a 59 y.o. who identifies as a female who was assigned female at birth, and is being seen today for cough, sore throat,  and congestion that started 5 days ago.  HPI: Cough This is a new problem. The current episode started in the past 7 days. The problem has been unchanged. The problem occurs every few minutes. The cough is Non-productive. Associated symptoms include a fever, headaches, myalgias, nasal congestion, postnasal drip, a sore throat, shortness of breath and wheezing. Pertinent negatives include no chills, ear congestion or ear pain. She has tried rest and OTC cough suppressant for the symptoms.    Problems:  Patient Active Problem List   Diagnosis Date Noted   Hypothyroidism due to Hashimoto's thyroiditis 03/12/2014   Hot flushes, perimenopausal 03/12/2014    Allergies: Allergies[1] Medications: Current Medications[2]  Observations/Objective: Patient is well-developed, well-nourished in no acute distress.  Resting comfortably  at home.  Head is normocephalic, atraumatic.  No labored breathing.  Speech is clear and coherent with logical content.  Patient is alert and oriented at baseline.  Dry nonproductive cough  Assessment and Plan: 1. Acute bronchitis, unspecified organism (Primary) - predniSONE  (STERAPRED UNI-PAK 21 TAB) 10 MG (21) TBPK tablet; Use as directed  Dispense: 21  tablet; Refill: 0 - benzonatate  (TESSALON ) 100 MG capsule; Take 1 capsule (100 mg total) by mouth 2 (two) times daily as needed for cough.  Dispense: 20 capsule; Refill: 0  - Take meds as prescribed - Use a cool mist humidifier  -Use saline nose sprays frequently -Force fluids -For any cough or  congestion  Use plain Mucinex- regular strength or max strength is fine -For fever or aces or pains- take tylenol  or ibuprofen. -Throat lozenges if help -Follow up if symptoms worsen or do not improve   Follow Up Instructions: I discussed the assessment and treatment plan with the patient. The patient was provided an opportunity to ask questions and all were answered. The patient agreed with the plan and demonstrated an understanding of the instructions.  A copy of instructions were sent to the patient via MyChart unless otherwise noted below.     The patient was advised to call back or seek an in-person evaluation if the symptoms worsen or if the condition fails to improve as anticipated.    Hannah Learn, FNP    [1] No Known Allergies [2]  Current Outpatient Medications:    benzonatate  (TESSALON ) 100 MG capsule, Take 1 capsule (100 mg total) by mouth 2 (two) times daily as needed for cough., Disp: 20 capsule, Rfl: 0   predniSONE  (STERAPRED UNI-PAK 21 TAB) 10 MG (21) TBPK tablet, Use as directed, Disp: 21 tablet, Rfl: 0   celecoxib (CELEBREX) 100 MG capsule, Take 100 mg by mouth 2 (two) times daily., Disp: , Rfl:    desvenlafaxine (PRISTIQ) 50 MG 24 hr tablet, Take 50 mg by mouth at bedtime., Disp: , Rfl:    loratadine (CLARITIN) 10 MG tablet, Take 10 mg by mouth daily., Disp: , Rfl:    LORazepam (ATIVAN) 0.5 MG tablet, Take 0.5 mg by mouth as needed for anxiety., Disp: , Rfl:    montelukast (SINGULAIR) 10 MG tablet, Take 10 mg by mouth daily., Disp: , Rfl:    SYNTHROID  100 MCG tablet, Take 1 tablet (100 mcg total) by mouth daily before breakfast., Disp: 90 tablet, Rfl: 2  "

## 2024-12-13 ENCOUNTER — Ambulatory Visit: Admitting: Internal Medicine
